# Patient Record
Sex: Female | Born: 1975 | Race: White | Hispanic: No | Marital: Married | Smoking: Never smoker
Health system: Southern US, Community
[De-identification: ages and names within clinical notes are randomized; demographics above are authoritative.]

---

## 2001-08-11 ENCOUNTER — Other Ambulatory Visit: Admission: RE | Admit: 2001-08-11 | Discharge: 2001-08-11 | Payer: Self-pay | Admitting: Gynecology

## 2002-09-15 ENCOUNTER — Other Ambulatory Visit: Admission: RE | Admit: 2002-09-15 | Discharge: 2002-09-15 | Payer: Self-pay | Admitting: Gynecology

## 2003-09-28 ENCOUNTER — Other Ambulatory Visit: Admission: RE | Admit: 2003-09-28 | Discharge: 2003-09-28 | Payer: Self-pay | Admitting: Gynecology

## 2005-01-11 ENCOUNTER — Other Ambulatory Visit: Admission: RE | Admit: 2005-01-11 | Discharge: 2005-01-11 | Payer: Self-pay | Admitting: Gynecology

## 2005-03-19 ENCOUNTER — Ambulatory Visit (HOSPITAL_COMMUNITY): Admission: RE | Admit: 2005-03-19 | Discharge: 2005-03-19 | Payer: Self-pay | Admitting: Family Medicine

## 2007-07-21 ENCOUNTER — Other Ambulatory Visit: Admission: RE | Admit: 2007-07-21 | Discharge: 2007-07-21 | Payer: Self-pay | Admitting: Obstetrics & Gynecology

## 2008-02-17 ENCOUNTER — Encounter: Payer: Self-pay | Admitting: Obstetrics & Gynecology

## 2008-02-17 ENCOUNTER — Inpatient Hospital Stay (HOSPITAL_COMMUNITY): Admission: RE | Admit: 2008-02-17 | Discharge: 2008-02-19 | Payer: Self-pay | Admitting: Obstetrics & Gynecology

## 2009-05-24 ENCOUNTER — Other Ambulatory Visit: Admission: RE | Admit: 2009-05-24 | Discharge: 2009-05-24 | Payer: Self-pay | Admitting: Obstetrics & Gynecology

## 2010-05-30 ENCOUNTER — Other Ambulatory Visit: Admission: RE | Admit: 2010-05-30 | Discharge: 2010-05-30 | Payer: Self-pay | Admitting: Obstetrics & Gynecology

## 2011-02-06 NOTE — Op Note (Signed)
Shelby Greene, Shelby Greene                ACCOUNT NO.:  [id number]   MEDICAL RECORD NO.:  [id number]          PATIENT TYPE:  INP   LOCATION:  9147                          FACILITY:  [facility]   PHYSICIAN:  Lazaro Arms, M.D.   DATE OF BIRTH:  1976/05/28   DATE OF PROCEDURE:  02/17/2008  DATE OF DISCHARGE:                               OPERATIVE REPORT   PREOPERATIVE DIAGNOSES:  1. Intrauterine pregnancy 39 plus weeks gestation.  2. Estimated fetal weight 4000 grams.  3. Inadequate pelvis with fetal vertex out of the pelvis and      unfavorable cervix.   POSTOPERATIVE DIAGNOSES:  1. Intrauterine pregnancy 39 plus weeks gestation.  2. Estimated fetal weight 4000 grams.  3. Inadequate pelvis with fetal vertex out of the pelvis and      unfavorable cervix.   PROCEDURE:  Primary low-transverse cesarean section.   SURGEON:  Lazaro Arms, MD   ANESTHESIA:  Spinal.   FINDINGS:  I talked with the patient and her husband in the office  extensively.  She had an ultrasound 2 weeks ago with an estimated fetal  weight of 3800 grams at the 97th percentile.  At this point, the fetal  vertex was out of the pelvis and was not induction candidate with cervix  being long taking close and presenting part not in the pelvis.  We  talked about her options of waiting for the fetal vertex to come down to  the pelvis versus proceeding with cesarean section with estimated fetal  weight around 4000 grams.  The patient really did not want to proceed  with a long induction if and when the fetal vertex drop due to the  pelvis.  She also does have somewhat narrow pubic arch.  So, she and her  husband decided to proceeded with primary cesarean section.  At the time  of surgery, we delivered a viable female.  Apgars of 9 at 10 with the  weight determined at nursery.  There was three-vessel cord.  She had a  nuchal cord and the cord was actually wrapped around both legs as well  individually, almost like a  figure-of-eight.  In the antrum, underwent  routine intrauterine resuscitation and to the pediatric service.  The  uterus, tubes, and ovaries were normal.  The placenta was normal and  sent pathology.  Cord gases had a pH 7.33.   DESCRIPTION OF OPERATION:  The patient was taken to the operating room  and placed in the sitting position where she underwent spinal  anesthetic.  She was then placed in the supine position with tilt to the  left.  She was prepped and draped in the usual sterile fashion.  A Foley  catheter was placed.  A Pfannenstiel skin incision was made and carried  down sharply to the rectus fascia, brought in midline, extended  laterally.  The fascia was taken out of the muscle superiorly and  inferiorly without difficulty and the muscles were divided.  No cavity  was entered.  Bladder blade was placed.  The vesicouterine aspiration  flap was created.  A  low-transverse hysterotomy incision was made.  The  patient was delivered a viable female infant with Apgars of 9 and 10 with  the weight determined at nursery.  There was three-vessel cord and cord  gas of 7.33.  The placenta was delivered spontaneously.  The uterus was  exteriorized and closed 2 layers, first being running interlocking  layer, the second being imbricating layer.  There was  good hemostasis.  The uterus placed in peritoneal cavity and the pelvis irrigated  vigorously with good hemostasis.  The muscles of peritoneum  reapproximated loosely.  The fascia was closed using 0 Vicryl running,  subcutaneous tissue was made hemostatic and irrigated and reapproximated  with interrupted 2-0 plain suture.  The  skin was closed using 4-0 Vicryl in subcuticular fashion and Dermabond.  The patient tolerated the procedure well.  She experienced 600 mL of  blood loss and taken to the recovery room in good and stable condition.  All counts were correct x3.  She received Ancef prophylactically.      Lazaro Arms, M.D.   Electronically Signed     LHE/MEDQ  D:  02/17/2008  T:  02/18/2008  Job:  [id number]

## 2011-02-06 NOTE — H&P (Signed)
NAMEMarland Kitchen  CHANON, LONEY NO.:  [id number]   MEDICAL RECORD NO.:  [id number]          PATIENT TYPE:  INP   LOCATION:  9147                          [facility]ACILITY:  [facility]   PHYSICIAN:  Lazaro Arms, M.D.   DATE OF BIRTH:  [birth year]   DATE OF ADMISSION:  02/17/2008  DATE OF DISCHARGE:                              HISTORY & PHYSICAL   HISTORY:  Shelby Greene is a 35 year old white female gravida 1, para 0,  estimated date of delivery Feb 19, 2008, currently 39 and 6/7th weeks'  gestation who is admitted for primary cesarean section.  The patient has  estimated fetal weight in the office 2 weeks ago of 3800 g.  We are here  at 39 weeks and 6 days' gestation, and the fetal vertex of the pelvis  and the cervix is long, thick, and closed.  We discussed all options are  at this point in toto with the estimated fetal weight being now  projected out 4000 g, and I was uncomfortable proceeding in a  nulliparous patient with the fetal vertex out of the pelvis for cervical  ripening and induction.  She understood this as well as her husband.  I  told them that we could certainly wait and see if the vertex came into  the pelvis within the next week or so and then proceed with induction.  They wanted to proceed at this point because of what I think is a fairly  significant risk of failure and/or shoulder dystocia based on the fetal  vertex out of the pelvis and estimated fetal weight of 4000 grams.  As a  result, we are proceeding with primary cesarean section.   PAST MEDICAL HISTORY:  Negative.   PAST SURGICAL HISTORY:  Negative except for eye surgery.   PAST OB HISTORY:  Nulliparous.   ALLERGIES:  MORPHINE makes her sick.   REVIEW OF SYSTEMS:  Otherwise, negative.   Her blood type is a O positive, antibody screen is negative, RPR is  negative, rubella is positive, hepatitis B is negative, HIV is negative,  urine drug screen is negative, her Pap is normal, GC and Chlamydia are  negative x2 and group B strep is negative.  varicella is immune.  AFP,  she declined.  Her glucola is 120.  All of her repeat HIV and RPRs are  negative.  Repeat GC and Chlamydia are negative.   PHYSICAL EXAMINATION:  HEENT:  Unremarkable.  Thyroid is normal.  LUNGS:  Clear.  HEART:  Regular rate and rhythm.  No murmur, rub, or gallop.  BREAST:  Deferred.  ABDOMEN:  Fundal height of 42 cm.  Cervix long, thick, and closed.  Fetal vertex out of the pelvis, narrow pubic arch.  EXTREMITIES:  Warm and 1+ edema.   IMPRESSION:  1. Intrauterine pregnancy at 57 and 6/7 weeks' gestation.  2. Estimated fetal weight 4000 g.  3. Fetal vertex out of the pelvis, unfavorable cervix.   PLAN:  The patient admitted for primary cesarean section.  She  understands risks, benefits, indications, alternatives, we will proceed.  Lazaro Arms, M.D.  Electronically Signed     LHE/MEDQ  D:  02/17/2008  T:  02/17/2008  Job:  [id number]

## 2011-02-06 NOTE — Discharge Summary (Signed)
NAMESARAFINA, PUTHOFF NO.:  [id number]   MEDICAL RECORD NO.:  [id number]          PATIENT TYPE:  INP   LOCATION:  9147                          FACILITY:  [facility]   PHYSICIAN:  Tilda Burrow, M.D. DATE OF BIRTH:  1976/04/24   DATE OF ADMISSION:  02/17/2008  DATE OF DISCHARGE:  02/19/2008                               DISCHARGE SUMMARY   ADMITTING DIAGNOSES:  Pregnancy 39+ weeks, large gestational age infant,  suspected inadequate pelvis.   POSTOPERATIVE DIAGNOSIS:  Pregnancy 39+ weeks, large gestational age  infant, suspected inadequate pelvis.   PROCEDURE:  Primary low transverse cervical cesarean section.   DISCHARGE MEDICATIONS:  1. Percocet 5/325 #40 one p.o. q.6 h. p.r.n. pain. 1-2 q.4 h. p.r.n.      pain.  2. Motrin 600 mg #30 one p.o. q.6 h. p.r.n. mild pain, refill x1.  3. Prenatal vitamins and iron to be continued daily.   FOLLOWUP:  Followup in 4 weeks.   HOSPITAL SUMMARY:  This 35 year old primiparous female followed at  [facility] for prenatal care.  She was admitted to [facility] for primary cesarean section as a _scheduled _primary c-  section__ by Dr. _Eure__  here for suspecting inadequate pelvis.  She  was admitted on Feb 17, 2008, for cesarean section.  Vertex is out of  the pelvis.  The patient underwent primary cesarean section with Apgars  of 9 and 10 at delivery.  The surgical procedure was uneventful with 600  mL of estimated blood loss.  Infant was a healthy female infant 7 pounds  and 15 ounces, 3605 grams.  Her postpartum course was uneventful with  the patient having blood type O positive, hemoglobin 10, and hematocrit  30.9 on Feb 18, 2008, compared to 13.7 and 39.2 on admission.  She was  stable for discharge to home on Feb 19, 2008, with incision looking  great.  No staples were used, and glue used to seal.  The patient will  be seeing at 4 weeks, routine postsurgical instructions were reviewed.      Tilda Burrow, M.D.  Electronically Signed     JVF/MEDQ  D:  02/19/2008  T:  02/19/2008  Job:  [id number]

## 2011-06-20 LAB — RPR: RPR Ser Ql: NONREACTIVE

## 2011-06-20 LAB — TYPE AND SCREEN
ABO/RH(D): O POS
Antibody Screen: NEGATIVE

## 2011-06-20 LAB — CBC
HCT: 39.2
Hemoglobin: 13.7
MCV: 85.7
MCV: 86.3
Platelets: 220
RBC: 3.58 — ABNORMAL LOW
RDW: 14.4
WBC: 10.4

## 2011-06-20 LAB — ABO/RH: ABO/RH(D): O POS

## 2011-06-20 LAB — URINALYSIS, ROUTINE W REFLEX MICROSCOPIC
Bilirubin Urine: NEGATIVE
Hgb urine dipstick: NEGATIVE
Nitrite: NEGATIVE
Protein, ur: NEGATIVE

## 2011-06-20 LAB — URINE MICROSCOPIC-ADD ON

## 2011-08-23 ENCOUNTER — Ambulatory Visit (HOSPITAL_COMMUNITY)
Admission: RE | Admit: 2011-08-23 | Discharge: 2011-08-23 | Disposition: A | Discharge status: Home / Self Care | Payer: BC Managed Care – PPO | Source: Ambulatory Visit | Attending: Internal Medicine | Admitting: Internal Medicine

## 2011-08-23 ENCOUNTER — Other Ambulatory Visit (HOSPITAL_COMMUNITY): Payer: Self-pay | Admitting: Internal Medicine

## 2011-08-23 DIAGNOSIS — J069 Acute upper respiratory infection, unspecified: Secondary | ICD-10-CM

## 2011-08-23 DIAGNOSIS — R05 Cough: Secondary | ICD-10-CM

## 2011-08-23 DIAGNOSIS — R059 Cough, unspecified: Secondary | ICD-10-CM

## 2011-11-01 ENCOUNTER — Other Ambulatory Visit: Payer: Self-pay | Admitting: Obstetrics & Gynecology

## 2011-11-01 ENCOUNTER — Other Ambulatory Visit (HOSPITAL_COMMUNITY)
Admission: RE | Admit: 2011-11-01 | Discharge: 2011-11-01 | Disposition: A | Discharge status: Home / Self Care | Payer: BC Managed Care – PPO | Source: Ambulatory Visit | Attending: Obstetrics & Gynecology | Admitting: Obstetrics & Gynecology

## 2011-11-01 DIAGNOSIS — Z01419 Encounter for gynecological examination (general) (routine) without abnormal findings: Secondary | ICD-10-CM | POA: Insufficient documentation

## 2012-04-13 ENCOUNTER — Encounter (HOSPITAL_COMMUNITY): Payer: Self-pay | Admitting: *Deleted

## 2012-04-13 ENCOUNTER — Emergency Department (HOSPITAL_COMMUNITY): Payer: BC Managed Care – PPO

## 2012-04-13 ENCOUNTER — Emergency Department (HOSPITAL_COMMUNITY)
Admission: EM | Admit: 2012-04-13 | Discharge: 2012-04-13 | Disposition: A | Discharge status: Home / Self Care | Payer: BC Managed Care – PPO | Attending: Emergency Medicine | Admitting: Emergency Medicine

## 2012-04-13 DIAGNOSIS — N200 Calculus of kidney: Secondary | ICD-10-CM | POA: Insufficient documentation

## 2012-04-13 LAB — URINALYSIS, ROUTINE W REFLEX MICROSCOPIC
Bilirubin Urine: NEGATIVE
Ketones, ur: NEGATIVE mg/dL
Nitrite: NEGATIVE
Protein, ur: NEGATIVE mg/dL
Specific Gravity, Urine: 1.022 (ref 1.005–1.030)
pH: 5.5 (ref 5.0–8.0)

## 2012-04-13 LAB — CBC WITH DIFFERENTIAL/PLATELET
Basophils Relative: 0 % (ref 0–1)
Eosinophils Absolute: 0.2 10*3/uL (ref 0.0–0.7)
HCT: 45.2 % (ref 36.0–46.0)
Lymphocytes Relative: 33 % (ref 12–46)
MCHC: 33.8 g/dL (ref 30.0–36.0)
Monocytes Absolute: 0.9 10*3/uL (ref 0.1–1.0)
Monocytes Relative: 7 % (ref 3–12)
Neutro Abs: 7.5 10*3/uL (ref 1.7–7.7)
Neutrophils Relative %: 59 % (ref 43–77)

## 2012-04-13 LAB — COMPREHENSIVE METABOLIC PANEL
AST: 15 U/L (ref 0–37)
BUN: 9 mg/dL (ref 6–23)
Calcium: 9.6 mg/dL (ref 8.4–10.5)
Chloride: 104 mEq/L (ref 96–112)
Creatinine, Ser: 0.68 mg/dL (ref 0.50–1.10)
GFR calc Af Amer: >90 mL/min (ref >90)
GFR calc non Af Amer: >90 mL/min (ref >90)
Potassium: 3.4 mEq/L — ABNORMAL LOW (ref 3.5–5.1)
Total Bilirubin: 0.3 mg/dL (ref 0.3–1.2)

## 2012-04-13 LAB — URINE MICROSCOPIC-ADD ON

## 2012-04-13 LAB — PREGNANCY, URINE: Preg Test, Ur: NEGATIVE

## 2012-04-13 MED ORDER — HYDROMORPHONE HCL PF 1 MG/ML IJ SOLN
1.0000 mg | Freq: Once | INTRAMUSCULAR | Status: AC
  Administered 2012-04-13: 1 mg via INTRAVENOUS
  Filled 2012-04-13: qty 1

## 2012-04-13 MED ORDER — OXYCODONE-ACETAMINOPHEN 10-325 MG PO TABS: 1.0000 | ORAL_TABLET | ORAL | Status: AC | PRN

## 2012-04-13 MED ORDER — ONDANSETRON HCL 4 MG/2ML IJ SOLN
4.0000 mg | Freq: Once | INTRAMUSCULAR | Status: AC
  Administered 2012-04-13: 4 mg via INTRAVENOUS
  Filled 2012-04-13: qty 2

## 2012-04-13 MED ORDER — CIPROFLOXACIN HCL 500 MG PO TABS: 500.0000 mg | ORAL_TABLET | Freq: Two times a day (BID) | ORAL | Status: AC

## 2012-04-13 MED ORDER — SODIUM CHLORIDE 0.9 % IV BOLUS (SEPSIS)
1000.0000 mL | Freq: Once | INTRAVENOUS | Status: AC
  Administered 2012-04-13: 1000 mL via INTRAVENOUS

## 2012-04-13 MED ORDER — FLUCONAZOLE 200 MG PO TABS: 200.0000 mg | ORAL_TABLET | Freq: Every day | ORAL | Status: AC

## 2012-04-13 MED ORDER — TAMSULOSIN HCL 0.4 MG PO CAPS: 0.4000 mg | ORAL_CAPSULE | Freq: Every day | ORAL | Status: DC

## 2012-04-13 MED ORDER — OXYCODONE-ACETAMINOPHEN 5-325 MG PO TABS
2.0000 | ORAL_TABLET | Freq: Once | ORAL | Status: AC
  Administered 2012-04-13: 2 via ORAL
  Filled 2012-04-13: qty 2

## 2012-04-13 MED ORDER — CIPROFLOXACIN IN D5W 400 MG/200ML IV SOLN
400.0000 mg | Freq: Once | INTRAVENOUS | Status: AC
  Administered 2012-04-13: 400 mg via INTRAVENOUS
  Filled 2012-04-13: qty 200

## 2012-04-13 MED ORDER — KETOROLAC TROMETHAMINE 30 MG/ML IJ SOLN
30.0000 mg | Freq: Once | INTRAMUSCULAR | Status: AC
  Administered 2012-04-13: 30 mg via INTRAMUSCULAR
  Filled 2012-04-13: qty 1

## 2012-04-13 NOTE — ED Notes (Signed)
Pt states unable to void.

## 2012-04-13 NOTE — ED Notes (Signed)
The pt says she cannot void 

## 2012-04-13 NOTE — ED Provider Notes (Signed)
Medical screening examination/treatment/procedure(s) were conducted as a shared visit with non-physician practitioner(s) and myself.  I personally evaluated the patient during the encounter 36 year old, female, complains of right flank pain, with nausea, which woke her up from sleep.  No history of the same.  No fever, vomiting, dysuria, or hematuria.  The patient was unable to get comfortable.  CAT scan shows obstructing stone.  Urinalysis shows bacteria.  Pain controlled.  Patient is afebrile.  No peritoneal signs.  We will give her and miotics and console.  Urology.  Cheri Guppy, MD 04/13/12 [phone]

## 2012-04-13 NOTE — ED Provider Notes (Signed)
History     CSN: [id number]  Arrival date & time 04/13/12  0102   First MD Initiated Contact with Patient 04/13/12 0255      Chief Complaint  Patient presents with  . Flank Pain   HPI  History provided by the patient. Patient is a 36 year old female with no significant past medical history who presents with complaints of acute onset right flank pain around 11 PM. Pain will patient while trying to sleep. Pain is described as very severe and sharp. There is slight radiation to the groin. Patient also reports having some dysuria. She denies noticing any hematuria. Patient denies any prior symptoms for onset of pains. Symptoms were associated with episode of nausea and vomiting. Patient states pain is slightly improving but still persistent. She denies any fever, chills, sweats. Patient denies any prior history of similar symptoms. No history of kidney stones. Patient does have father with history of multiple kidney stones.     History reviewed. No pertinent past medical history.  History reviewed. No pertinent past surgical history.  No family history on file.  History  Substance Use Topics  . Smoking status: Never Smoker   . Smokeless tobacco: Not on file  . Alcohol Use: No    OB History    Grav Para Term Preterm Abortions TAB SAB Ect Mult Living                  Review of Systems  Constitutional: Negative for fever and appetite change.  Respiratory: Negative for shortness of breath.   Cardiovascular: Negative for chest pain.  Gastrointestinal: Positive for nausea and vomiting. Negative for abdominal pain.  Genitourinary: Positive for dysuria and flank pain. Negative for frequency, hematuria, vaginal bleeding and vaginal discharge.    Allergies  Morphine and related  Home Medications   Current Outpatient Rx  Name Route Sig Dispense Refill  . NORETHINDRONE-ETH ESTRADIOL 1-50 MG-MCG PO TABS Oral Take 1 tablet by mouth daily.      BP 110/62  Pulse 72  Temp 98 F  (36.7 C) (Oral)  Resp 20  SpO2 95%  Physical Exam  Nursing note and vitals reviewed. Constitutional: She is oriented to person, place, and time. She appears well-developed and well-nourished. No distress.  HENT:  Head: Normocephalic.  Cardiovascular: Normal rate and regular rhythm.   Pulmonary/Chest: Effort normal and breath sounds normal.  Abdominal: Soft. There is CVA tenderness. There is no rigidity, no rebound, no guarding and no tenderness at McBurney's point.       Right CVA tenderness  Neurological: She is alert and oriented to person, place, and time.  Skin: Skin is warm and dry.  Psychiatric: She has a normal mood and affect. Her behavior is normal.    ED Course  Procedures  Results for orders placed during the hospital encounter of 04/13/12  URINALYSIS, ROUTINE W REFLEX MICROSCOPIC      Component Value Range   Color, Urine YELLOW  YELLOW   APPearance CLOUDY (*) CLEAR   Specific Gravity, Urine 1.022  1.005 - 1.030   pH 5.5  5.0 - 8.0   Glucose, UA NEGATIVE  NEGATIVE mg/dL   Hgb urine dipstick LARGE (*) NEGATIVE   Bilirubin Urine NEGATIVE  NEGATIVE   Ketones, ur NEGATIVE  NEGATIVE mg/dL   Protein, ur NEGATIVE  NEGATIVE mg/dL   Urobilinogen, UA 1.0  0.0 - 1.0 mg/dL   Nitrite NEGATIVE  NEGATIVE   Leukocytes, UA SMALL (*) NEGATIVE  PREGNANCY, URINE  Component Value Range   Preg Test, Ur NEGATIVE  NEGATIVE  CBC WITH DIFFERENTIAL      Component Value Range   WBC 12.8 (*) 4.0 - 10.5 K/uL   RBC 5.29 (*) 3.87 - 5.11 MIL/uL   Hemoglobin 15.3 (*) 12.0 - 15.0 g/dL   HCT 16.1  09.6 - 04.5 %   MCV 85.4  78.0 - 100.0 fL   MCH 28.9  26.0 - 34.0 pg   MCHC 33.8  30.0 - 36.0 g/dL   RDW 40.9  81.1 - 91.4 %   Platelets 248  150 - 400 K/uL   Neutrophils Relative 59  43 - 77 %   Neutro Abs 7.5  1.7 - 7.7 K/uL   Lymphocytes Relative 33  12 - 46 %   Lymphs Abs 4.3 (*) 0.7 - 4.0 K/uL   Monocytes Relative 7  3 - 12 %   Monocytes Absolute 0.9  0.1 - 1.0 K/uL   Eosinophils  Relative 1  0 - 5 %   Eosinophils Absolute 0.2  0.0 - 0.7 K/uL   Basophils Relative 0  0 - 1 %   Basophils Absolute 0.0  0.0 - 0.1 K/uL  COMPREHENSIVE METABOLIC PANEL      Component Value Range   Sodium 140  135 - 145 mEq/L   Potassium 3.4 (*) 3.5 - 5.1 mEq/L   Chloride 104  96 - 112 mEq/L   CO2 22  19 - 32 mEq/L   Glucose, Bld 111 (*) 70 - 99 mg/dL   BUN 9  6 - 23 mg/dL   Creatinine, Ser 7.82  0.50 - 1.10 mg/dL   Calcium 9.6  8.4 - 95.6 mg/dL   Total Protein 7.6  6.0 - 8.3 g/dL   Albumin 3.7  3.5 - 5.2 g/dL   AST 15  0 - 37 U/L   ALT 12  0 - 35 U/L   Alkaline Phosphatase 72  39 - 117 U/L   Total Bilirubin 0.3  0.3 - 1.2 mg/dL   GFR calc non Af Amer >90  >90 mL/min   GFR calc Af Amer >90  >90 mL/min  URINE MICROSCOPIC-ADD ON      Component Value Range   Squamous Epithelial / LPF FEW (*) RARE   WBC, UA 3-6  <3 WBC/hpf   RBC / HPF 21-50  <3 RBC/hpf   Bacteria, UA MANY (*) RARE   Urine-Other MUCOUS PRESENT         Ct Abdomen Pelvis Wo Contrast  04/13/2012  *RADIOLOGY REPORT*  Clinical Data: Severe right flank pain.  CT ABDOMEN AND PELVIS WITHOUT CONTRAST  Technique:  Multidetector CT imaging of the abdomen and pelvis was performed following the standard protocol without intravenous contrast.  Comparison: None.  Findings: Mild dependent changes in the lung bases.  Small esophageal hiatal hernia.  There is a right-sided pyelocaliectasis and ureterectasis down to the ureterovesicle junction where there is a 3 mm stone.  Mild periureteral stranding.  Changes are consistent with mild to moderate obstruction.  No renal or ureteral stone or obstruction on the left.  Bladder is decompressed.  The unenhanced appearance of the liver, spleen, gallbladder, pancreas, adrenal glands, abdominal aorta, and retroperitoneal lymph nodes is unremarkable.  The stomach, small bowel, and colon are decompressed.  No free air or free fluid in the abdomen.  A small accessory spleen.  Pelvis:  The uterus and  adnexal structures are not enlarged.  No free or loculated pelvic fluid collections.  No inflammatory changes in the sigmoid colon.  The appendix is normal.  Normal alignment of the lumbar vertebrae with mild degenerative changes in the lower thoracic spine.  IMPRESSION: 3 mm stone in the distal right ureter with mild to moderate proximal obstruction.  Original Report Authenticated By: Marlon Pel, M.D.     1. Kidney stone       MDM  3:10AM patient seen and evaluated. Patient is uncomfortable appearing. Patient has right CVA tenderness. Also with a lot of blood in urine. Wasn't CT scan to rule out possible kidney stone. We'll also treat with pain medications. We'll give portal, a lot of and Zofran.   Patient discussed with attending physician. Patient with signs of a 3 mm stone distal right ureter. Urine does show 3-6 white blood cells and many bacteria. Patient is currently afebrile but we'll plan to consult neurology for concerns of possible septic stone or to have close followup.  Spoke with on-call urologist. Recommend starting patient on either Cipro or Bactrim. They will follow patient in office. Patient should be instructed to return sooner either to emergency room or followup in the office if develops any fever, chills, sweats or persistent nausea and vomiting. Will also send urine for culture.      Angus Seller, [place] 04/14/12 2211

## 2012-04-13 NOTE — ED Notes (Signed)
Pt states understanding of discharge instructions 

## 2012-04-13 NOTE — ED Notes (Signed)
The pt is c/o severe rt flank pain since 1630.  No previous history.  lmp none

## 2012-04-14 LAB — URINE CULTURE
Colony Count: NO GROWTH
Culture: NO GROWTH

## 2012-04-15 NOTE — ED Provider Notes (Signed)
Medical screening examination/treatment/procedure(s) were performed by non-physician practitioner and as supervising physician I was immediately available for consultation/collaboration.  Kassadie Pancake, MD 04/15/12 0702 

## 2012-10-10 ENCOUNTER — Other Ambulatory Visit: Payer: Self-pay | Admitting: Adult Health

## 2012-10-10 DIAGNOSIS — N644 Mastodynia: Secondary | ICD-10-CM

## 2012-10-10 DIAGNOSIS — N6320 Unspecified lump in the left breast, unspecified quadrant: Secondary | ICD-10-CM

## 2012-10-15 ENCOUNTER — Ambulatory Visit
Admission: RE | Admit: 2012-10-15 | Discharge: 2012-10-15 | Disposition: A | Discharge status: Home / Self Care | Payer: BC Managed Care – PPO | Source: Ambulatory Visit | Attending: Adult Health | Admitting: Adult Health

## 2012-10-15 DIAGNOSIS — N644 Mastodynia: Secondary | ICD-10-CM

## 2012-10-15 DIAGNOSIS — N6320 Unspecified lump in the left breast, unspecified quadrant: Secondary | ICD-10-CM

## 2012-10-20 ENCOUNTER — Other Ambulatory Visit: Payer: BC Managed Care – PPO

## 2012-12-16 ENCOUNTER — Other Ambulatory Visit: Payer: Self-pay | Admitting: Obstetrics & Gynecology

## 2013-03-02 ENCOUNTER — Other Ambulatory Visit: Payer: Self-pay | Admitting: *Deleted

## 2013-03-02 MED ORDER — ETONOGESTREL-ETHINYL ESTRADIOL 0.12-0.015 MG/24HR VA RING: VAGINAL_RING | VAGINAL | Status: DC

## 2013-07-13 ENCOUNTER — Telehealth: Payer: Self-pay | Admitting: Obstetrics & Gynecology

## 2013-07-13 NOTE — Telephone Encounter (Signed)
Pt c/o vaginal burning and itching, given abx for sinus infection last week. Pt requesting Diflucan e-scribed.

## 2013-07-14 MED ORDER — FLUCONAZOLE 150 MG PO TABS: 150.0000 mg | ORAL_TABLET | Freq: Once | ORAL | Status: DC

## 2013-07-14 NOTE — Telephone Encounter (Signed)
Pt informed Diflucan e-scribed 

## 2014-01-21 ENCOUNTER — Other Ambulatory Visit (HOSPITAL_COMMUNITY)
Admission: RE | Admit: 2014-01-21 | Discharge: 2014-01-21 | Disposition: A | Discharge status: Home / Self Care | Payer: BC Managed Care – PPO | Source: Ambulatory Visit | Attending: Obstetrics & Gynecology | Admitting: Obstetrics & Gynecology

## 2014-01-21 ENCOUNTER — Ambulatory Visit (INDEPENDENT_AMBULATORY_CARE_PROVIDER_SITE_OTHER): Payer: BC Managed Care – PPO | Admitting: Obstetrics & Gynecology

## 2014-01-21 ENCOUNTER — Encounter: Payer: Self-pay | Admitting: Obstetrics & Gynecology

## 2014-01-21 VITALS — BP 110/80 | Ht 63.0 in | Wt 190.0 lb

## 2014-01-21 DIAGNOSIS — Z01419 Encounter for gynecological examination (general) (routine) without abnormal findings: Secondary | ICD-10-CM

## 2014-01-21 DIAGNOSIS — E039 Hypothyroidism, unspecified: Secondary | ICD-10-CM

## 2014-01-21 DIAGNOSIS — Z1151 Encounter for screening for human papillomavirus (HPV): Secondary | ICD-10-CM | POA: Insufficient documentation

## 2014-01-21 MED ORDER — HYDROCORTISONE 2.5 % EX CREA: TOPICAL_CREAM | Freq: Two times a day (BID) | CUTANEOUS | Status: DC

## 2014-01-21 MED ORDER — ETONOGESTREL-ETHINYL ESTRADIOL 0.12-0.015 MG/24HR VA RING: VAGINAL_RING | VAGINAL | Status: DC

## 2014-01-21 NOTE — Progress Notes (Signed)
Patient ID: Shelby Greene, female   DOB: [birth year], 38 y.o.   MRN: [id number] Subjective:     Shelby Greene is a 38 y.o. female here for a routine exam.  No LMP recorded. Patient is not currently having periods (Reason: Oral contraceptives). No obstetric history on file. Birth Control Method:  nuva ring Menstrual Calendar(currently): regular  Current complaints: none.   Current acute medical issues:  none   Recent Gynecologic History No LMP recorded. Patient is not currently having periods (Reason: Oral contraceptives). Last Pap: 2013,  normal Last mammogram: ,    History reviewed. No pertinent past medical history.  History reviewed. No pertinent past surgical history.  OB History   Grav Para Term Preterm Abortions TAB SAB Ect Mult Living                  History   Social History  . Marital Status: Married    Spouse Name: N/A    Number of Children: N/A  . Years of Education: N/A   Social History Main Topics  . Smoking status: Never Smoker   . Smokeless tobacco: None  . Alcohol Use: No  . Drug Use: None  . Sexual Activity: Yes   Other Topics Concern  . None   Social History Narrative  . None    History reviewed. No pertinent family history.   Review of Systems  Review of Systems  Constitutional: Negative for fever, chills, weight loss, malaise/fatigue and diaphoresis.  HENT: Negative for hearing loss, ear pain, nosebleeds, congestion, sore throat, neck pain, tinnitus and ear discharge.   Eyes: Negative for blurred vision, double vision, photophobia, pain, discharge and redness.  Respiratory: Negative for cough, hemoptysis, sputum production, shortness of breath, wheezing and stridor.   Cardiovascular: Negative for chest pain, palpitations, orthopnea, claudication, leg swelling and PND.  Gastrointestinal: negative for abdominal pain. Negative for heartburn, nausea, vomiting, diarrhea, constipation, blood in stool and melena.  Genitourinary: Negative for dysuria,  urgency, frequency, hematuria and flank pain.  Musculoskeletal: Negative for myalgias, back pain, joint pain and falls.  Skin: Negative for itching and rash.  Neurological: Negative for dizziness, tingling, tremors, sensory change, speech change, focal weakness, seizures, loss of consciousness, weakness and headaches.  Endo/Heme/Allergies: Negative for environmental allergies and polydipsia. Does not bruise/bleed easily.  Psychiatric/Behavioral: Negative for depression, suicidal ideas, hallucinations, memory loss and substance abuse. The patient is not nervous/anxious and does not have insomnia.        Objective:    Physical Exam  Vitals reviewed. Constitutional: She is oriented to person, place, and time. She appears well-developed and well-nourished.  HENT:  Head: Normocephalic and atraumatic.        Right Ear: External ear normal.  Left Ear: External ear normal.  Nose: Nose normal.  Mouth/Throat: Oropharynx is clear and moist.  Eyes: Conjunctivae and EOM are normal. Pupils are equal, round, and reactive to light. Right eye exhibits no discharge. Left eye exhibits no discharge. No scleral icterus.  Neck: Normal range of motion. Neck supple. No tracheal deviation present. No thyromegaly present.  Cardiovascular: Normal rate, regular rhythm, normal heart sounds and intact distal pulses.  Exam reveals no gallop and no friction rub.   No murmur heard. Respiratory: Effort normal and breath sounds normal. No respiratory distress. She has no wheezes. She has no rales. She exhibits no tenderness.  GI: Soft. Bowel sounds are normal. She exhibits no distension and no mass. There is no tenderness. There is no rebound and no guarding.  Genitourinary:  Breasts no masses skin changes or nipple changes bilaterally      Vulva is normal without lesions Vagina is pink moist without discharge Cervix normal in appearance and pap is done Uterus is normal size shape and contour Adnexa is negative with  normal sized ovaries    Musculoskeletal: Normal range of motion. She exhibits no edema and no tenderness.  Neurological: She is alert and oriented to person, place, and time. She has normal reflexes. She displays normal reflexes. No cranial nerve deficit. She exhibits normal muscle tone. Coordination normal.  Skin: Skin is warm and dry. No rash noted. No erythema. No pallor.  Psychiatric: She has a normal mood and affect. Her behavior is normal. Judgment and thought content normal.       Assessment:    Healthy female exam.    Plan:    Contraception: NuvaRing vaginal inserts. Follow up in: 1 year.

## 2014-01-21 NOTE — Addendum Note (Signed)
Addended by: Richardson ChiquitoRAVIS, ASHLEY M on: 01/21/2014 04:11 PM   Modules accepted: Orders

## 2014-01-21 NOTE — Addendum Note (Signed)
Addended by: Criss AlvinePULLIAM, Jolina Symonds G on: 01/21/2014 03:26 PM   Modules accepted: Orders

## 2014-01-22 LAB — TSH: TSH: 2.141 u[IU]/mL (ref 0.350–4.500)

## 2014-02-04 ENCOUNTER — Telehealth: Payer: Self-pay | Admitting: Obstetrics & Gynecology

## 2014-02-04 NOTE — Telephone Encounter (Signed)
Pt informed of within normal pap results and TSH from 01/21/2014.

## 2014-04-12 ENCOUNTER — Telehealth: Payer: Self-pay | Admitting: *Deleted

## 2014-04-12 NOTE — Telephone Encounter (Signed)
Pt states treated with antibiotics for sinus infection, now has a yeast infection. Pt aware Dr. Despina HiddenEure has left office for today will be back in office tomorrow.   Requesting RX for yeast infection.

## 2014-04-13 ENCOUNTER — Other Ambulatory Visit: Payer: Self-pay | Admitting: Obstetrics & Gynecology

## 2014-04-13 ENCOUNTER — Telehealth: Payer: Self-pay | Admitting: Obstetrics & Gynecology

## 2014-04-13 NOTE — Telephone Encounter (Signed)
Pt informed request for Diflucan is in Dr. Alyssa GroveEures in basket and is still pending, we did not call in Nuvaring instead of the Diflucan.

## 2014-07-28 ENCOUNTER — Telehealth: Payer: Self-pay | Admitting: *Deleted

## 2014-07-28 ENCOUNTER — Telehealth: Payer: Self-pay | Admitting: Obstetrics & Gynecology

## 2014-07-28 NOTE — Telephone Encounter (Signed)
Pt requesting Diflucan recently prescribed antibiotic for sinus infection.

## 2014-07-28 NOTE — Telephone Encounter (Signed)
Duplicate message. 

## 2014-07-29 MED ORDER — FLUCONAZOLE 150 MG PO TABS: ORAL_TABLET | ORAL | Status: DC

## 2014-12-14 ENCOUNTER — Telehealth: Payer: Self-pay | Admitting: Obstetrics & Gynecology

## 2014-12-14 MED ORDER — SULFAMETHOXAZOLE-TRIMETHOPRIM 800-160 MG PO TABS: 1.0000 | ORAL_TABLET | Freq: Two times a day (BID) | ORAL | Status: DC

## 2014-12-14 NOTE — Telephone Encounter (Signed)
Pt c/o burning with urination, urgency with little output x 1 day. Pt requesting RX for antibiotic.

## 2014-12-15 ENCOUNTER — Telehealth: Payer: Self-pay | Admitting: Obstetrics & Gynecology

## 2014-12-15 MED ORDER — FLUCONAZOLE 150 MG PO TABS: ORAL_TABLET | ORAL | Status: DC

## 2014-12-15 NOTE — Telephone Encounter (Signed)
Pt requesting RX for Diflucan with refill.

## 2015-02-13 ENCOUNTER — Other Ambulatory Visit: Payer: Self-pay | Admitting: Obstetrics & Gynecology

## 2015-02-14 ENCOUNTER — Telehealth: Payer: Self-pay | Admitting: *Deleted

## 2015-02-14 NOTE — Telephone Encounter (Signed)
Requesting RX for Nuvaring

## 2015-06-14 ENCOUNTER — Telehealth: Payer: Self-pay | Admitting: Obstetrics & Gynecology

## 2015-06-14 MED ORDER — FLUCONAZOLE 150 MG PO TABS: ORAL_TABLET | ORAL | Status: DC

## 2015-06-14 NOTE — Telephone Encounter (Signed)
Pt requesting refill on Diflucan.  

## 2016-04-09 ENCOUNTER — Telehealth: Payer: Self-pay | Admitting: *Deleted

## 2016-04-09 NOTE — Telephone Encounter (Signed)
Pharmacy called requesting refill on Nuvaring

## 2016-04-10 ENCOUNTER — Telehealth: Payer: Self-pay | Admitting: Obstetrics & Gynecology

## 2016-04-10 MED ORDER — ETONOGESTREL-ETHINYL ESTRADIOL 0.12-0.015 MG/24HR VA RING: VAGINAL_RING | VAGINAL | Status: DC

## 2016-06-07 ENCOUNTER — Other Ambulatory Visit: Payer: Self-pay | Admitting: Adult Health

## 2016-06-07 DIAGNOSIS — Z1231 Encounter for screening mammogram for malignant neoplasm of breast: Secondary | ICD-10-CM

## 2016-06-25 ENCOUNTER — Other Ambulatory Visit: Payer: BC Managed Care – PPO | Admitting: Obstetrics & Gynecology

## 2016-07-03 ENCOUNTER — Ambulatory Visit
Admission: RE | Admit: 2016-07-03 | Discharge: 2016-07-03 | Disposition: A | Discharge status: Home / Self Care | Payer: BC Managed Care – PPO | Source: Ambulatory Visit | Attending: Adult Health | Admitting: Adult Health

## 2016-07-03 ENCOUNTER — Encounter: Payer: Self-pay | Admitting: Obstetrics & Gynecology

## 2016-07-03 ENCOUNTER — Ambulatory Visit (INDEPENDENT_AMBULATORY_CARE_PROVIDER_SITE_OTHER): Payer: BC Managed Care – PPO | Admitting: Obstetrics & Gynecology

## 2016-07-03 ENCOUNTER — Other Ambulatory Visit (HOSPITAL_COMMUNITY)
Admission: RE | Admit: 2016-07-03 | Discharge: 2016-07-03 | Disposition: A | Discharge status: Home / Self Care | Payer: BC Managed Care – PPO | Source: Ambulatory Visit | Attending: Obstetrics & Gynecology | Admitting: Obstetrics & Gynecology

## 2016-07-03 VITALS — BP 128/90 | HR 90 | Ht 64.0 in | Wt 216.0 lb

## 2016-07-03 DIAGNOSIS — E7800 Pure hypercholesterolemia, unspecified: Secondary | ICD-10-CM

## 2016-07-03 DIAGNOSIS — R7989 Other specified abnormal findings of blood chemistry: Secondary | ICD-10-CM

## 2016-07-03 DIAGNOSIS — E038 Other specified hypothyroidism: Secondary | ICD-10-CM

## 2016-07-03 DIAGNOSIS — Z01419 Encounter for gynecological examination (general) (routine) without abnormal findings: Secondary | ICD-10-CM | POA: Insufficient documentation

## 2016-07-03 DIAGNOSIS — Z1231 Encounter for screening mammogram for malignant neoplasm of breast: Secondary | ICD-10-CM

## 2016-07-03 MED ORDER — ETONOGESTREL-ETHINYL ESTRADIOL 0.12-0.015 MG/24HR VA RING: VAGINAL_RING | VAGINAL | 12 refills | Status: DC

## 2016-07-03 NOTE — Progress Notes (Signed)
Subjective:     Shelby MillerDeidra Greene is a 40 y.o. female here for a routine exam.  No LMP recorded. Patient is not currently having periods (Reason: Oral contraceptives). No obstetric history on file. Birth Control Method:  nuva ring Menstrual Calendar(currently): regular  Current complaints: none.   Current acute medical issues:  none   Recent Gynecologic History No LMP recorded. Patient is not currently having periods (Reason: Oral contraceptives). Last Pap: 2014,  normal Last mammogram: ,    History reviewed. No pertinent past medical history.  History reviewed. No pertinent surgical history.  OB History    No data available      Social History   Social History  . Marital status: Married    Spouse name: N/A  . Number of children: N/A  . Years of education: N/A   Social History Main Topics  . Smoking status: Never Smoker  . Smokeless tobacco: Never Used  . Alcohol use No  . Drug use: Unknown  . Sexual activity: Yes   Other Topics Concern  . None   Social History Narrative  . None    History reviewed. No pertinent family history.   Current Outpatient Prescriptions:  .  etonogestrel-ethinyl estradiol (NUVARING) 0.12-0.015 MG/24HR vaginal ring, INSERT VAGINALLY AND LEAVE IN PLACE FOR 3 CONSECUTIVE WEEKS THEN REMOVE FOR 1 WEEK, Disp: 1 each, Rfl: 11 .  etonogestrel-ethinyl estradiol (NUVARING) 0.12-0.015 MG/24HR vaginal ring, Insert vaginally and leave in place for 3 consecutive weeks, then remove for 1 week., Disp: 1 each, Rfl: 12 .  norethindrone-ethinyl estradiol (OVCON-50) 1-50 MG-MCG tablet, Take 1 tablet by mouth daily., Disp: , Rfl:   Review of Systems  Review of Systems  Constitutional: Negative for fever, chills, weight loss, malaise/fatigue and diaphoresis.  HENT: Negative for hearing loss, ear pain, nosebleeds, congestion, sore throat, neck pain, tinnitus and ear discharge.   Eyes: Negative for blurred vision, double vision, photophobia, pain, discharge and  redness.  Respiratory: Negative for cough, hemoptysis, sputum production, shortness of breath, wheezing and stridor.   Cardiovascular: Negative for chest pain, palpitations, orthopnea, claudication, leg swelling and PND.  Gastrointestinal: negative for abdominal pain. Negative for heartburn, nausea, vomiting, diarrhea, constipation, blood in stool and melena.  Genitourinary: Negative for dysuria, urgency, frequency, hematuria and flank pain.  Musculoskeletal: Negative for myalgias, back pain, joint pain and falls.  Skin: Negative for itching and rash.  Neurological: Negative for dizziness, tingling, tremors, sensory change, speech change, focal weakness, seizures, loss of consciousness, weakness and headaches.  Endo/Heme/Allergies: Negative for environmental allergies and polydipsia. Does not bruise/bleed easily.  Psychiatric/Behavioral: Negative for depression, suicidal ideas, hallucinations, memory loss and substance abuse. The patient is not nervous/anxious and does not have insomnia.        Objective:  Blood pressure 128/90, pulse 90, height 5\' 4"  (1.626 m), weight 216 lb (98 kg).   Physical Exam  Vitals reviewed. Constitutional: She is oriented to person, place, and time. She appears well-developed and well-nourished.  HENT:  Head: Normocephalic and atraumatic.        Right Ear: External ear normal.  Left Ear: External ear normal.  Nose: Nose normal.  Mouth/Throat: Oropharynx is clear and moist.  Eyes: Conjunctivae and EOM are normal. Pupils are equal, round, and reactive to light. Right eye exhibits no discharge. Left eye exhibits no discharge. No scleral icterus.  Neck: Normal range of motion. Neck supple. No tracheal deviation present. No thyromegaly present.  Cardiovascular: Normal rate, regular rhythm, normal heart sounds and intact distal pulses.  Exam reveals no gallop and no friction rub.   No murmur heard. Respiratory: Effort normal and breath sounds normal. No respiratory  distress. She has no wheezes. She has no rales. She exhibits no tenderness.  GI: Soft. Bowel sounds are normal. She exhibits no distension and no mass. There is no tenderness. There is no rebound and no guarding.  Genitourinary:  Breasts no masses skin changes or nipple changes bilaterally      Vulva is normal without lesions Vagina is pink moist without discharge Cervix normal in appearance and pap is done Uterus is normal size shape and contour Adnexa is negative with normal sized ovaries   Musculoskeletal: Normal range of motion. She exhibits no edema and no tenderness.  Neurological: She is alert and oriented to person, place, and time. She has normal reflexes. She displays normal reflexes. No cranial nerve deficit. She exhibits normal muscle tone. Coordination normal.  Skin: Skin is warm and dry. No rash noted. No erythema. No pallor.  Psychiatric: She has a normal mood and affect. Her behavior is normal. Judgment and thought content normal.       Medications Ordered at today's visit: Meds ordered this encounter  Medications  . etonogestrel-ethinyl estradiol (NUVARING) 0.12-0.015 MG/24HR vaginal ring    Sig: Insert vaginally and leave in place for 3 consecutive weeks, then remove for 1 week.    Dispense:  1 each    Refill:  12    Other orders placed at today's visit: Orders Placed This Encounter  Procedures  . Lipid Profile  . TSH  . Vitamin D (25 hydroxy)      Assessment:    Healthy female exam.    Plan:    Contraception: NuvaRing vaginal inserts. Mammogram ordered. Follow up in: 2 years.     No Follow-up on file.

## 2016-07-04 LAB — CYTOLOGY - PAP

## 2016-07-05 ENCOUNTER — Encounter: Payer: Self-pay | Admitting: Obstetrics & Gynecology

## 2016-07-05 LAB — LIPID PANEL
CHOL/HDL RATIO: 3 ratio (ref 0.0–4.4)
Cholesterol, Total: 198 mg/dL (ref 100–199)
HDL: 67 mg/dL (ref >39)
LDL Calculated: 101 mg/dL — ABNORMAL HIGH (ref 0–99)
Triglycerides: 151 mg/dL — ABNORMAL HIGH (ref 0–149)
VLDL Cholesterol Cal: 30 mg/dL (ref 5–40)

## 2016-07-05 LAB — TSH: TSH: 1.99 u[IU]/mL (ref 0.450–4.500)

## 2016-07-05 LAB — VITAMIN D 25 HYDROXY (VIT D DEFICIENCY, FRACTURES): VIT D 25 HYDROXY: 30.7 ng/mL (ref 30.0–100.0)

## 2017-01-07 ENCOUNTER — Telehealth: Payer: Self-pay | Admitting: Obstetrics & Gynecology

## 2017-01-07 ENCOUNTER — Other Ambulatory Visit: Payer: Self-pay | Admitting: Obstetrics & Gynecology

## 2017-01-07 MED ORDER — SCOPOLAMINE 1 MG/3DAYS TD PT72: 1.0000 | MEDICATED_PATCH | TRANSDERMAL | 12 refills | Status: DC

## 2017-01-07 NOTE — Telephone Encounter (Signed)
PT called stating that she would like for Dr. Despina Hidden to call in some motion sickness patches, because she is going on a cruise in two weeks. Please contact pt

## 2017-02-05 ENCOUNTER — Other Ambulatory Visit (HOSPITAL_COMMUNITY): Payer: Self-pay | Admitting: Family Medicine

## 2017-02-05 DIAGNOSIS — Z136 Encounter for screening for cardiovascular disorders: Secondary | ICD-10-CM

## 2017-02-06 ENCOUNTER — Ambulatory Visit (HOSPITAL_COMMUNITY)
Admission: RE | Admit: 2017-02-06 | Discharge: 2017-02-06 | Disposition: A | Discharge status: Home / Self Care | Payer: Self-pay | Source: Ambulatory Visit | Attending: Cardiovascular Disease | Admitting: Cardiovascular Disease

## 2017-02-06 DIAGNOSIS — Z0000 Encounter for general adult medical examination without abnormal findings: Secondary | ICD-10-CM

## 2017-02-06 DIAGNOSIS — Z8249 Family history of ischemic heart disease and other diseases of the circulatory system: Secondary | ICD-10-CM

## 2017-02-06 DIAGNOSIS — Z136 Encounter for screening for cardiovascular disorders: Secondary | ICD-10-CM

## 2017-06-05 ENCOUNTER — Encounter: Payer: Self-pay | Admitting: Obstetrics & Gynecology

## 2017-06-05 ENCOUNTER — Other Ambulatory Visit: Payer: Self-pay | Admitting: *Deleted

## 2017-06-06 ENCOUNTER — Other Ambulatory Visit: Payer: Self-pay | Admitting: Obstetrics & Gynecology

## 2017-06-06 MED ORDER — ETONOGESTREL-ETHINYL ESTRADIOL 0.12-0.015 MG/24HR VA RING: VAGINAL_RING | VAGINAL | 12 refills | Status: DC

## 2017-08-27 ENCOUNTER — Other Ambulatory Visit: Payer: Self-pay | Admitting: Adult Health

## 2017-08-27 DIAGNOSIS — Z139 Encounter for screening, unspecified: Secondary | ICD-10-CM

## 2017-10-04 ENCOUNTER — Ambulatory Visit
Admission: RE | Admit: 2017-10-04 | Discharge: 2017-10-04 | Disposition: A | Discharge status: Home / Self Care | Payer: BC Managed Care – PPO | Source: Ambulatory Visit | Attending: Adult Health | Admitting: Adult Health

## 2017-10-04 DIAGNOSIS — Z139 Encounter for screening, unspecified: Secondary | ICD-10-CM

## 2018-02-24 ENCOUNTER — Other Ambulatory Visit: Payer: Self-pay | Admitting: Sports Medicine

## 2018-02-24 DIAGNOSIS — M5126 Other intervertebral disc displacement, lumbar region: Secondary | ICD-10-CM

## 2018-02-25 ENCOUNTER — Ambulatory Visit
Admission: RE | Admit: 2018-02-25 | Discharge: 2018-02-25 | Disposition: A | Discharge status: Home / Self Care | Payer: BC Managed Care – PPO | Source: Ambulatory Visit | Attending: Sports Medicine | Admitting: Sports Medicine

## 2018-02-25 DIAGNOSIS — M5126 Other intervertebral disc displacement, lumbar region: Secondary | ICD-10-CM

## 2018-09-03 ENCOUNTER — Other Ambulatory Visit: Payer: Self-pay | Admitting: Obstetrics & Gynecology

## 2018-09-07 ENCOUNTER — Other Ambulatory Visit: Payer: Self-pay | Admitting: Obstetrics & Gynecology

## 2019-11-09 ENCOUNTER — Other Ambulatory Visit: Payer: Self-pay

## 2019-11-09 ENCOUNTER — Other Ambulatory Visit (HOSPITAL_COMMUNITY)
Admission: RE | Admit: 2019-11-09 | Discharge: 2019-11-09 | Disposition: A | Discharge status: Home / Self Care | Payer: BC Managed Care – PPO | Source: Ambulatory Visit | Attending: Obstetrics & Gynecology | Admitting: Obstetrics & Gynecology

## 2019-11-09 ENCOUNTER — Encounter: Payer: Self-pay | Admitting: Obstetrics & Gynecology

## 2019-11-09 ENCOUNTER — Ambulatory Visit (INDEPENDENT_AMBULATORY_CARE_PROVIDER_SITE_OTHER): Payer: BC Managed Care – PPO | Admitting: Obstetrics & Gynecology

## 2019-11-09 VITALS — BP 126/81 | HR 81 | Ht 63.0 in | Wt 223.0 lb

## 2019-11-09 DIAGNOSIS — Z01419 Encounter for gynecological examination (general) (routine) without abnormal findings: Secondary | ICD-10-CM | POA: Diagnosis present

## 2019-11-09 MED ORDER — ETONOGESTREL-ETHINYL ESTRADIOL 0.12-0.015 MG/24HR VA RING: VAGINAL_RING | VAGINAL | 12 refills | Status: DC

## 2019-11-09 NOTE — Progress Notes (Signed)
Subjective:     Shelby Greene is a 44 y.o. female here for a routine exam.  No LMP recorded. (Menstrual status: Oral contraceptives). No obstetric history on file. Birth Control Method:  nuva ring Menstrual Calendar(currently): regular  Current complaints: none.   Current acute medical issues:  none   Recent Gynecologic History No LMP recorded. (Menstrual status: Oral contraceptives). Last Pap: 2017,  normal Last mammogram: 2020,  Recommended to have yearly  No past medical history on file.  No past surgical history on file.  OB History   No obstetric history on file.     Social History   Socioeconomic History  . Marital status: Married    Spouse name: Not on file  . Number of children: Not on file  . Years of education: Not on file  . Highest education level: Not on file  Occupational History  . Not on file  Tobacco Use  . Smoking status: Never Smoker  . Smokeless tobacco: Never Used  Substance and Sexual Activity  . Alcohol use: No  . Drug use: Not Currently  . Sexual activity: Yes  Other Topics Concern  . Not on file  Social History Narrative  . Not on file   Social Determinants of Health   Financial Resource Strain:   . Difficulty of Paying Living Expenses: Not on file  Food Insecurity:   . Worried About Charity fundraiser in the Last Year: Not on file  . Ran Out of Food in the Last Year: Not on file  Transportation Needs:   . Lack of Transportation (Medical): Not on file  . Lack of Transportation (Non-Medical): Not on file  Physical Activity:   . Days of Exercise per Week: Not on file  . Minutes of Exercise per Session: Not on file  Stress:   . Feeling of Stress : Not on file  Social Connections:   . Frequency of Communication with Friends and Family: Not on file  . Frequency of Social Gatherings with Friends and Family: Not on file  . Attends Religious Services: Not on file  . Active Member of Clubs or Organizations: Not on file  . Attends English as a second language teacher Meetings: Not on file  . Marital Status: Not on file    No family history on file.   Current Outpatient Medications:  .  etonogestrel-ethinyl estradiol (NUVARING) 0.12-0.015 MG/24HR vaginal ring, INSERT 1 RING VAGINALLY AND LEAVE IN PLACE FOR 3 CONSECUTIVE WEEKS, THEN REMOVE FOR 1 WEEK., Disp: 1 each, Rfl: 12 .  scopolamine (TRANSDERM-SCOP, 1.5 MG,) 1 MG/3DAYS, Place 1 patch (1.5 mg total) onto the skin every 3 (three) days. Put first patch on at least 24 hours before leaving on the cruise (Patient not taking: Reported on 11/09/2019), Disp: 10 patch, Rfl: 12  Review of Systems  Review of Systems  Constitutional: Negative for fever, chills, weight loss, malaise/fatigue and diaphoresis.  HENT: Negative for hearing loss, ear pain, nosebleeds, congestion, sore throat, neck pain, tinnitus and ear discharge.   Eyes: Negative for blurred vision, double vision, photophobia, pain, discharge and redness.  Respiratory: Negative for cough, hemoptysis, sputum production, shortness of breath, wheezing and stridor.   Cardiovascular: Negative for chest pain, palpitations, orthopnea, claudication, leg swelling and PND.  Gastrointestinal: negative for abdominal pain. Negative for heartburn, nausea, vomiting, diarrhea, constipation, blood in stool and melena.  Genitourinary: Negative for dysuria, urgency, frequency, hematuria and flank pain.  Musculoskeletal: Negative for myalgias, back pain, joint pain and falls.  Skin: Negative for  itching and rash.  Neurological: Negative for dizziness, tingling, tremors, sensory change, speech change, focal weakness, seizures, loss of consciousness, weakness and headaches.  Endo/Heme/Allergies: Negative for environmental allergies and polydipsia. Does not bruise/bleed easily.  Psychiatric/Behavioral: Negative for depression, suicidal ideas, hallucinations, memory loss and substance abuse. The patient is not nervous/anxious and does not have insomnia.         Objective:  Blood pressure 126/81, pulse 81, height 5\' 3"  (1.6 m), weight 223 lb (101.2 kg).   Physical Exam  Vitals reviewed. Constitutional: She is oriented to person, place, and time. She appears well-developed and well-nourished.  HENT:  Head: Normocephalic and atraumatic.        Right Ear: External ear normal.  Left Ear: External ear normal.  Nose: Nose normal.  Mouth/Throat: Oropharynx is clear and moist.  Eyes: Conjunctivae and EOM are normal. Pupils are equal, round, and reactive to light. Right eye exhibits no discharge. Left eye exhibits no discharge. No scleral icterus.  Neck: Normal range of motion. Neck supple. No tracheal deviation present. No thyromegaly present.  Cardiovascular: Normal rate, regular rhythm, normal heart sounds and intact distal pulses.  Exam reveals no gallop and no friction rub.   No murmur heard. Respiratory: Effort normal and breath sounds normal. No respiratory distress. She has no wheezes. She has no rales. She exhibits no tenderness.  GI: Soft. Bowel sounds are normal. She exhibits no distension and no mass. There is no tenderness. There is no rebound and no guarding.  Genitourinary:  Breasts no masses skin changes or nipple changes bilaterally      Vulva is normal without lesions Vagina is pink moist without discharge Cervix normal in appearance and pap is done Uterus is normal size shape and contour Adnexa is negative with normal sized ovaries   Musculoskeletal: Normal range of motion. She exhibits no edema and no tenderness.  Neurological: She is alert and oriented to person, place, and time. She has normal reflexes. She displays normal reflexes. No cranial nerve deficit. She exhibits normal muscle tone. Coordination normal.  Skin: Skin is warm and dry. No rash noted. No erythema. No pallor.  Psychiatric: She has a normal mood and affect. Her behavior is normal. Judgment and thought content normal.       Medications Ordered at today's  visit: Meds ordered this encounter  Medications  . etonogestrel-ethinyl estradiol (NUVARING) 0.12-0.015 MG/24HR vaginal ring    Sig: INSERT 1 RING VAGINALLY AND LEAVE IN PLACE FOR 3 CONSECUTIVE WEEKS, THEN REMOVE FOR 1 WEEK.    Dispense:  1 each    Refill:  12    Other orders placed at today's visit: No orders of the defined types were placed in this encounter.     Assessment:    Healthy female exam.    Plan:    Contraception: NuvaRing vaginal inserts. Mammogram ordered. Follow up in: 3 years.     No follow-ups on file.

## 2019-11-11 LAB — CYTOLOGY - PAP
Adequacy: ABSENT
Comment: NEGATIVE
Diagnosis: NEGATIVE
High risk HPV: NEGATIVE

## 2020-07-27 ENCOUNTER — Other Ambulatory Visit: Payer: Self-pay | Admitting: Obstetrics & Gynecology

## 2020-07-27 ENCOUNTER — Telehealth: Payer: Self-pay | Admitting: *Deleted

## 2020-07-27 ENCOUNTER — Telehealth: Payer: Self-pay | Admitting: Obstetrics & Gynecology

## 2020-07-27 MED ORDER — FLUCONAZOLE 150 MG PO TABS: ORAL_TABLET | ORAL | 1 refills | Status: DC

## 2020-07-27 NOTE — Telephone Encounter (Signed)
Patient with c/o vaginal itching and thinks she has a yeast infection.  Requesting Diflucan be sent to her pharmacy.

## 2020-07-27 NOTE — Telephone Encounter (Signed)
Patient request fluconizol meds sent to pharmacy. patient states she has yeast infection and would like medication sent to pharmacy. Patient uses [facility] [address] [place].

## 2020-09-25 ENCOUNTER — Telehealth: Payer: Self-pay | Admitting: Emergency Medicine

## 2020-09-25 ENCOUNTER — Ambulatory Visit (INDEPENDENT_AMBULATORY_CARE_PROVIDER_SITE_OTHER): Payer: BC Managed Care – PPO

## 2020-09-25 ENCOUNTER — Ambulatory Visit
Admission: EM | Admit: 2020-09-25 | Discharge: 2020-09-25 | Disposition: A | Discharge status: Home / Self Care | Payer: BC Managed Care – PPO | Attending: Family Medicine | Admitting: Family Medicine

## 2020-09-25 ENCOUNTER — Other Ambulatory Visit: Payer: Self-pay

## 2020-09-25 ENCOUNTER — Encounter: Payer: Self-pay | Admitting: Emergency Medicine

## 2020-09-25 DIAGNOSIS — R062 Wheezing: Secondary | ICD-10-CM

## 2020-09-25 DIAGNOSIS — R059 Cough, unspecified: Secondary | ICD-10-CM

## 2020-09-25 DIAGNOSIS — J22 Unspecified acute lower respiratory infection: Secondary | ICD-10-CM

## 2020-09-25 MED ORDER — FLUCONAZOLE 150 MG PO TABS: 150.0000 mg | ORAL_TABLET | Freq: Every day | ORAL | 0 refills | Status: AC

## 2020-09-25 MED ORDER — AZITHROMYCIN 250 MG PO TABS: 250.0000 mg | ORAL_TABLET | Freq: Every day | ORAL | 0 refills | Status: DC

## 2020-09-25 MED ORDER — ALBUTEROL SULFATE HFA 108 (90 BASE) MCG/ACT IN AERS: 1.0000 | INHALATION_SPRAY | Freq: Four times a day (QID) | RESPIRATORY_TRACT | 0 refills | Status: AC | PRN

## 2020-09-25 MED ORDER — PREDNISONE 20 MG PO TABS: 20.0000 mg | ORAL_TABLET | Freq: Two times a day (BID) | ORAL | 0 refills | Status: DC

## 2020-09-25 NOTE — ED Triage Notes (Signed)
sinus congestion x 2 weeks. chest congestion and some shortness of breath with activity for past week.

## 2020-09-25 NOTE — Discharge Instructions (Addendum)
Make sure you are drinking plenty of water Take the antibiotic per instructions.  2 today then 1 a day until gone Take prednisone 2 times a day for 5 days.  This helps reduce bronchial inflammation Use the albuterol inhaler as needed for wheezing Expect improvement over next 2 to 3 days Exercise caution/quarantine until Covid test is available

## 2020-09-25 NOTE — ED Provider Notes (Signed)
[facility]    CSN: [id number] Arrival date & time: 09/25/20  1243      History   Chief Complaint No chief complaint on file.   HPI Shelby Greene is a 45 y.o. female.   HPI   Patient has had coughing and chest congestion for 2 weeks.  Is localized in her left chest.  Initially she had some sinus drainage and sore throat.  This is gotten better.  She has shortness of breath with activity.  No underlying lung disease.  Does not smoke cigarettes.  She has noted wheezing.  Has never used an inhaler. Is not Covid vaccinated Has not been exposed to Covid  History reviewed. No pertinent past medical history.  There are no problems to display for this patient.   History reviewed. No pertinent surgical history.  OB History   No obstetric history on file.      Home Medications    Prior to Admission medications   Medication Sig Start Date End Date Taking? Authorizing Provider  albuterol (VENTOLIN HFA) 108 (90 Base) MCG/ACT inhaler Inhale 1-2 puffs into the lungs every 6 (six) hours as needed for wheezing or shortness of breath. 09/25/20  Yes Raylene Everts, MD  azithromycin (ZITHROMAX) 250 MG tablet Take 1 tablet (250 mg total) by mouth daily. Take first 2 tablets together, then 1 every day until finished. 09/25/20  Yes Raylene Everts, MD  etonogestrel-ethinyl estradiol (NUVARING) 0.12-0.015 MG/24HR vaginal ring INSERT 1 RING VAGINALLY AND LEAVE IN PLACE FOR 3 CONSECUTIVE WEEKS, THEN REMOVE FOR 1 WEEK. 11/09/19  Yes Florian Buff, MD  predniSONE (DELTASONE) 20 MG tablet Take 1 tablet (20 mg total) by mouth 2 (two) times daily with a meal. 09/25/20  Yes Raylene Everts, MD    Family History No family history on file.  Social History Social History   Tobacco Use  . Smoking status: Never Smoker  . Smokeless tobacco: Never Used  Vaping Use  . Vaping Use: Never used  Substance Use Topics  . Alcohol use: No  . Drug use: Not Currently     Allergies    Morphine and related   Review of Systems Review of Systems See HPI  Physical Exam Triage Vital Signs ED Triage Vitals  Enc Vitals Group     BP 09/25/20 1329 124/85     Pulse Rate 09/25/20 1329 82     Resp 09/25/20 1329 15     Temp 09/25/20 1329 98.2 F (36.8 C)     Temp src --      SpO2 09/25/20 1329 97 %     Weight 09/25/20 1338 215 lb (97.5 kg)     Height 09/25/20 1338 5\' 4"  (1.626 m)     Head Circumference --      Peak Flow --      Pain Score 09/25/20 1338 0     Pain Loc --      Pain Edu? --      Excl. in [place]? --    No data found.  Updated Vital Signs BP 124/85   Pulse 82   Temp 98.2 F (36.8 C)   Resp 15   Ht 5\' 4"  (1.626 m)   Wt 97.5 kg   SpO2 97%   BMI 36.90 kg/m      Physical Exam Constitutional:      General: She is not in acute distress.    Appearance: She is well-developed and well-nourished.     Comments: Overweight.  Appears tired  HENT:     Head: Normocephalic and atraumatic.     Nose:     Comments: Mask is in place    Mouth/Throat:     Mouth: Oropharynx is clear and moist.  Eyes:     Conjunctiva/sclera: Conjunctivae normal.     Pupils: Pupils are equal, round, and reactive to light.  Cardiovascular:     Rate and Rhythm: Normal rate and regular rhythm.     Heart sounds: Normal heart sounds.  Pulmonary:     Effort: Pulmonary effort is normal. No respiratory distress.     Breath sounds: Wheezing present. No rales.     Comments: Wheezing in left chest only Abdominal:     General: There is no distension.     Palpations: Abdomen is soft.  Musculoskeletal:        General: No edema. Normal range of motion.     Cervical back: Normal range of motion.  Skin:    General: Skin is warm and dry.  Neurological:     Mental Status: She is alert.  Psychiatric:        Behavior: Behavior normal.      UC Treatments / Results  Labs (all labs ordered are listed, but only abnormal results are displayed) Labs Reviewed  NOVEL CORONAVIRUS, NAA     EKG   Radiology DG Chest 2 View  Result Date: 09/25/2020 CLINICAL DATA:  Cough and wheezing for 2 weeks. EXAM: CHEST - 2 VIEW COMPARISON:  August 23, 2011 FINDINGS: The heart size and mediastinal contours are within normal limits. Both lungs are clear. The visualized skeletal structures are unremarkable. IMPRESSION: No active cardiopulmonary disease. Electronically Signed   By: Sherian Rein M.D.   On: 09/25/2020 14:21    Procedures Procedures (including critical care time)  Medications Ordered in UC Medications - No data to display  Initial Impression / Assessment and Plan / UC Course  I have reviewed the triage vital signs and the nursing notes.  Pertinent labs & imaging results that were available during my care of the patient were reviewed by me and considered in my medical decision making (see chart for details).     We will treat for viral bronchitis.  Symptoms lasted longer than 2 weeks x 1 and an antibiotic.  Covid test is still pending. Final Clinical Impressions(s) / UC Diagnoses   Final diagnoses:  Cough  LRTI (lower respiratory tract infection)     Discharge Instructions     Make sure you are drinking plenty of water Take the antibiotic per instructions.  2 today then 1 a day until gone Take prednisone 2 times a day for 5 days.  This helps reduce bronchial inflammation Use the albuterol inhaler as needed for wheezing Expect improvement over next 2 to 3 days Exercise caution/quarantine until Covid test is available     ED Prescriptions    Medication Sig Dispense Auth. Provider   albuterol (VENTOLIN HFA) 108 (90 Base) MCG/ACT inhaler Inhale 1-2 puffs into the lungs every 6 (six) hours as needed for wheezing or shortness of breath. 18 g Eustace Moore, MD   azithromycin (ZITHROMAX) 250 MG tablet Take 1 tablet (250 mg total) by mouth daily. Take first 2 tablets together, then 1 every day until finished. 6 tablet Eustace Moore, MD   predniSONE  (DELTASONE) 20 MG tablet Take 1 tablet (20 mg total) by mouth 2 (two) times daily with a meal. 10 tablet Eustace Moore, MD  PDMP not reviewed this encounter.   Eustace Moore, MD 09/25/20 1452

## 2020-09-27 LAB — NOVEL CORONAVIRUS, NAA: SARS-CoV-2, NAA: NOT DETECTED

## 2020-09-27 LAB — SARS-COV-2, NAA 2 DAY TAT

## 2020-12-23 HISTORY — PX: SHOULDER SURGERY: SHX246

## 2020-12-30 ENCOUNTER — Other Ambulatory Visit: Payer: Self-pay

## 2020-12-30 MED ORDER — ETONOGESTREL-ETHINYL ESTRADIOL 0.12-0.015 MG/24HR VA RING: VAGINAL_RING | VAGINAL | 12 refills | Status: DC

## 2021-01-09 ENCOUNTER — Other Ambulatory Visit: Payer: Self-pay

## 2021-02-07 ENCOUNTER — Encounter: Payer: Self-pay | Admitting: Adult Health

## 2021-02-07 ENCOUNTER — Other Ambulatory Visit: Payer: Self-pay

## 2021-02-07 ENCOUNTER — Ambulatory Visit (INDEPENDENT_AMBULATORY_CARE_PROVIDER_SITE_OTHER): Payer: BC Managed Care – PPO | Admitting: Adult Health

## 2021-02-07 VITALS — BP 138/90 | HR 87 | Ht 63.0 in | Wt 230.0 lb

## 2021-02-07 DIAGNOSIS — N7689 Other specified inflammation of vagina and vulva: Secondary | ICD-10-CM | POA: Diagnosis not present

## 2021-02-07 DIAGNOSIS — Z1231 Encounter for screening mammogram for malignant neoplasm of breast: Secondary | ICD-10-CM | POA: Insufficient documentation

## 2021-02-07 DIAGNOSIS — Z01419 Encounter for gynecological examination (general) (routine) without abnormal findings: Secondary | ICD-10-CM | POA: Insufficient documentation

## 2021-02-07 NOTE — Progress Notes (Signed)
  Subjective:     Patient ID: Shelby Greene, female   DOB: 09-15-1976, 45 y.o.   MRN: [id number]  HPI Marta is a 45 year old white female, married, G1P1,in complaining of ?lump in vagina, noticed Friday after sex. Has had right shoulder surgery,in sling. PCP is [facility] [place].   Review of Systems ?felt lump, or swollen area in vagina Friday after sex, when putting ring in Reviewed past medical,surgical, social and family history. Reviewed medications and allergies.     Objective:   Physical Exam BP 138/90 (BP Location: Left Arm, Patient Position: Sitting, Cuff Size: Large)   Pulse 87   Ht 5\' 3"  (1.6 m)   Wt 230 lb (104.3 kg)   BMI 40.74 kg/m    Skin warm and dry.Pelvic: external genitalia is normal in appearance no lesions, vagina: pink with good moisture, no lumps or masses seen or felt,urethra has no lesions or masses noted, cervix:smooth and bulbous, uterus: normal size, shape and contour, non tender, no masses felt, adnexa: no masses or tenderness noted. Bladder is non tender and no masses felt.   [place] - 02/07/21 1413      Pregnancy Intention Screening   Does the patient want to become pregnant in the next year? No    Does the patient's partner want to become pregnant in the next year? No    Would the patient like to discuss contraceptive options today? No      Contraception Wrap Up   Current Method Vaginal Ring    End Method Vaginal Ring    Contraception Counseling Provided No         Examination chaperoned by Tish RN  Assessment:     1. Screening mammogram for breast cancer Pt to call for appt for screening mammogram  - MM 3D SCREEN BREAST BILATERAL; Future  2. Normal vaginal exam     Plan:     Follow up prn

## 2021-06-14 ENCOUNTER — Ambulatory Visit
Admission: RE | Admit: 2021-06-14 | Discharge: 2021-06-14 | Disposition: A | Discharge status: Home / Self Care | Payer: BC Managed Care – PPO | Source: Ambulatory Visit | Attending: Adult Health | Admitting: Adult Health

## 2021-06-14 ENCOUNTER — Other Ambulatory Visit: Payer: Self-pay

## 2021-06-14 DIAGNOSIS — Z1231 Encounter for screening mammogram for malignant neoplasm of breast: Secondary | ICD-10-CM

## 2021-06-28 DIAGNOSIS — R000 Tachycardia, unspecified: Secondary | ICD-10-CM | POA: Insufficient documentation

## 2021-06-28 NOTE — Progress Notes (Signed)
Patient referred by Josefa Half* for tachycardia  Subjective:   Shelby Greene, female    DOB: June 17, 1976, 45 y.o.   MRN: [id number]   Chief Complaint  Patient presents with   Tachycardia   New Patient (Initial Visit)     HPI  45 y.o.  female with family history of early CAD, now with palpitations and chest pain symptoms.  Patient works in a [employer], lives fairly sedentary lifestyle.  Her father has had multiple MIs and strokes, first MI in 71s.  Patient recently has experienced episodes of fast heart rate upwards of 100 bpm while at rest.  This is noticed on her apple watch.  Separately, she is also had episodes of chest pain lasting for few seconds at a time.  She denies any shortness of breath, presyncope, syncope symptoms.   History reviewed. No pertinent past medical history.   Past Surgical History:  Procedure Laterality Date   CESAREAN SECTION     SHOULDER SURGERY  12/2020     Social History   Tobacco Use  Smoking Status Never  Smokeless Tobacco Never    Social History   Substance and Sexual Activity  Alcohol Use No     Family History  Problem Relation Age of Onset   Heart disease Paternal Grandfather    Colon cancer Paternal Grandmother    Colon cancer Maternal Grandmother      Current Outpatient Medications on File Prior to Visit  Medication Sig Dispense Refill   ACETAMINOPHEN EXTRA STRENGTH 500 MG capsule SMARTSIG:2 Capsule(s) By Mouth Every 8 Hours PRN     albuterol (VENTOLIN HFA) 108 (90 Base) MCG/ACT inhaler Inhale 1-2 puffs into the lungs every 6 (six) hours as needed for wheezing or shortness of breath. 18 g 0   cetirizine (ZYRTEC) 10 MG tablet Take by mouth.     diclofenac (VOLTAREN) 50 MG EC tablet Take 50 mg by mouth 2 (two) times daily.     etonogestrel-ethinyl estradiol (NUVARING) 0.12-0.015 MG/24HR vaginal ring INSERT 1 RING VAGINALLY AND LEAVE IN PLACE FOR 3 CONSECUTIVE WEEKS, THEN REMOVE FOR 1 WEEK. 1 each 12    methocarbamol (ROBAXIN) 750 MG tablet Take 750 mg by mouth every 8 (eight) hours as needed.     predniSONE (DELTASONE) 20 MG tablet Take 1 tablet (20 mg total) by mouth 2 (two) times daily with a meal. 10 tablet 0   sertraline (ZOLOFT) 50 MG tablet Take 25 tablets by mouth daily.     No current facility-administered medications on file prior to visit.    Cardiovascular and other pertinent studies:  EKG 06/29/2021: Sinus rhythm 93 bpm Normal EKG   Recent labs: 05/24/2021: Glucose 86, BUN/Cr 11/0.68. EGFR >90. Na/K 140/4. Rest of the CMP normal H/H 14/42. MCV 82. Platelets 281 Chol 169, TG 162, HDL 57, LDL 96 HbA1C N/A TSH N/A    Review of Systems  Cardiovascular:  Positive for chest pain and palpitations. Negative for dyspnea on exertion, leg swelling and syncope.        Vitals:   06/29/21 0854  BP: 129/86  Pulse: 91  Resp: 16  Temp: 98 F (36.7 C)  SpO2: 97%     Body mass index is 41.27 kg/m. Filed Weights   06/29/21 0854  Weight: 233 lb (105.7 kg)     Objective:   Physical Exam Vitals and nursing note reviewed.  Constitutional:      General: She is not in acute distress. Neck:     Vascular:  No JVD.  Cardiovascular:     Rate and Rhythm: Normal rate and regular rhythm.     Heart sounds: Normal heart sounds. No murmur heard. Pulmonary:     Effort: Pulmonary effort is normal.     Breath sounds: Normal breath sounds. No wheezing or rales.  Musculoskeletal:     Right lower leg: No edema.     Left lower leg: No edema.          Assessment & Recommendations:    45 y.o.  female with family history of early CAD, now with palpitations and chest pain symptoms.  Tachycardia, palpitations: Will enroll in cardio logs to monitor her apple watch tracings to evaluate for any tachyarrhythmias.  Discussed vagal maneuvers.  Chest pain: Atypical.  Will perform exercise treadmill stress test and obtain CT cardiac scoring given her family history of early  CAD.  Further recommendations after above testing.   Thank you for referring the patient to [place]. Please feel free to contact with any questions.   Nigel Mormon, MD Pager: [phone] Office: [phone]

## 2021-06-29 ENCOUNTER — Other Ambulatory Visit: Payer: Self-pay

## 2021-06-29 ENCOUNTER — Ambulatory Visit: Payer: BC Managed Care – PPO | Admitting: Cardiology

## 2021-06-29 ENCOUNTER — Encounter: Payer: Self-pay | Admitting: Cardiology

## 2021-06-29 VITALS — BP 129/86 | HR 91 | Temp 98.0°F | Resp 16 | Ht 63.0 in | Wt 233.0 lb

## 2021-06-29 DIAGNOSIS — R0789 Other chest pain: Secondary | ICD-10-CM

## 2021-06-29 DIAGNOSIS — R000 Tachycardia, unspecified: Secondary | ICD-10-CM

## 2021-06-29 DIAGNOSIS — Z8249 Family history of ischemic heart disease and other diseases of the circulatory system: Secondary | ICD-10-CM

## 2021-07-12 ENCOUNTER — Ambulatory Visit (INDEPENDENT_AMBULATORY_CARE_PROVIDER_SITE_OTHER): Payer: BC Managed Care – PPO | Admitting: Podiatry

## 2021-07-12 ENCOUNTER — Ambulatory Visit (INDEPENDENT_AMBULATORY_CARE_PROVIDER_SITE_OTHER): Payer: BC Managed Care – PPO

## 2021-07-12 ENCOUNTER — Other Ambulatory Visit: Payer: Self-pay

## 2021-07-12 DIAGNOSIS — S9000XA Contusion of unspecified ankle, initial encounter: Secondary | ICD-10-CM

## 2021-07-12 DIAGNOSIS — S9001XA Contusion of right ankle, initial encounter: Secondary | ICD-10-CM | POA: Diagnosis not present

## 2021-07-12 DIAGNOSIS — S93402A Sprain of unspecified ligament of left ankle, initial encounter: Secondary | ICD-10-CM

## 2021-07-12 MED ORDER — OXYCODONE HCL 5 MG PO TABS: 5.0000 mg | ORAL_TABLET | ORAL | 0 refills | Status: DC | PRN

## 2021-07-17 ENCOUNTER — Other Ambulatory Visit: Payer: BC Managed Care – PPO

## 2021-07-21 NOTE — Progress Notes (Signed)
   HPI: 45 y.o. female presenting today as a new patient for evaluation of the left ankle sprain.  Patient believes she possibly broke her ankle.  She states that she fell in her driveway just an hour before presentation.  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  She says the pain is 10/10.  Presenting today with her husband  No past medical history on file.   Physical Exam: General: The patient is alert and oriented x3 in no acute distress.  Dermatology: Skin is warm, dry and supple bilateral lower extremities. Negative for open lesions or macerations.  Vascular: Palpable pedal pulses bilaterally.  Capillary refill within normal limits.  Moderate edema noted with some mild ecchymosis to the lateral aspect of the foot  Neurological: Epicritic and protective threshold grossly intact bilaterally.   Musculoskeletal Exam: Overall there is gross alignment of the foot and ankle.  No abnormality seen visually  Radiographic Exam:  Normal osseous mineralization. Joint spaces preserved. No fracture/dislocation/boney destruction.  No evidence of fracture  Assessment: 1.  Moderate/severe ankle sprain left   Plan of Care:  1. Patient evaluated. X-Rays reviewed.  2.  Pursue conservative treatments including rest ice compression and elevation 3.  Ace wrap applied.  Recommend Ace wrap daily 4.  Cam boot dispensed.  Weightbearing as tolerated 5.  Prescription for oxycodone 5 mg as needed severe pain  6.  Return to clinic in 4 weeks      Felecia Shelling, DPM [facility]  Dr. Felecia Shelling, DPM    [address] [place], [place] [address]                Office [phone]  Fax [phone]

## 2021-08-07 ENCOUNTER — Ambulatory Visit
Admission: RE | Admit: 2021-08-07 | Discharge: 2021-08-07 | Disposition: A | Discharge status: Home / Self Care | Payer: No Typology Code available for payment source | Source: Ambulatory Visit | Attending: Cardiology | Admitting: Cardiology

## 2021-08-07 DIAGNOSIS — Z8249 Family history of ischemic heart disease and other diseases of the circulatory system: Secondary | ICD-10-CM

## 2021-08-07 DIAGNOSIS — R0789 Other chest pain: Secondary | ICD-10-CM

## 2021-08-14 ENCOUNTER — Ambulatory Visit: Payer: BC Managed Care – PPO | Admitting: Cardiology

## 2021-08-14 ENCOUNTER — Other Ambulatory Visit: Payer: Self-pay

## 2021-08-14 ENCOUNTER — Ambulatory Visit: Payer: BC Managed Care – PPO

## 2021-08-14 DIAGNOSIS — Z8249 Family history of ischemic heart disease and other diseases of the circulatory system: Secondary | ICD-10-CM

## 2021-08-14 DIAGNOSIS — R0789 Other chest pain: Secondary | ICD-10-CM | POA: Insufficient documentation

## 2021-08-14 NOTE — Progress Notes (Signed)
Error

## 2021-08-16 ENCOUNTER — Encounter: Payer: Self-pay | Admitting: Student

## 2021-08-16 ENCOUNTER — Other Ambulatory Visit: Payer: Self-pay

## 2021-08-16 ENCOUNTER — Ambulatory Visit: Payer: BC Managed Care – PPO | Admitting: Student

## 2021-08-16 VITALS — BP 127/82 | HR 92 | Temp 98.5°F | Ht 63.0 in | Wt 224.0 lb

## 2021-08-16 DIAGNOSIS — R0789 Other chest pain: Secondary | ICD-10-CM

## 2021-08-16 DIAGNOSIS — R002 Palpitations: Secondary | ICD-10-CM

## 2021-08-16 DIAGNOSIS — R000 Tachycardia, unspecified: Secondary | ICD-10-CM

## 2021-08-16 MED ORDER — METOPROLOL SUCCINATE ER 25 MG PO TB24: 25.0000 mg | ORAL_TABLET | Freq: Every day | ORAL | 3 refills | Status: DC

## 2021-08-16 NOTE — Progress Notes (Signed)
Patient referred by Josefa Half* for tachycardia  Subjective:   Fredrich Birks, female    DOB: Nov 29, 1975, 45 y.o.   MRN: [id number]   Chief Complaint  Patient presents with   Chest Pain   Follow-up   Results     HPI  45 y.o.  female with family history of early CAD, referred for evaluation of palpitations and chest pain symptoms.  Given patient's symptoms of palpitation she is enrolled in cardio logs monitoring, which has revealed no significant cardiac arrhythmias, just sinus rhythm.  Also due to chest pain symptoms patient underwent exercise stress test and coronary calcium scoring.  Total coronary calcium score was 0 and stress test noted low exercise capacity with EKG changes equivocal for ischemia.  Patient reports she continues to have intermittent episodes of palpitations and chest discomfort.  Denies dyspnea, syncope, near syncope.   Current Outpatient Medications on File Prior to Visit  Medication Sig Dispense Refill   albuterol (VENTOLIN HFA) 108 (90 Base) MCG/ACT inhaler Inhale 1-2 puffs into the lungs every 6 (six) hours as needed for wheezing or shortness of breath. 18 g 0   cetirizine (ZYRTEC) 10 MG tablet Take by mouth.     etonogestrel-ethinyl estradiol (NUVARING) 0.12-0.015 MG/24HR vaginal ring INSERT 1 RING VAGINALLY AND LEAVE IN PLACE FOR 3 CONSECUTIVE WEEKS, THEN REMOVE FOR 1 WEEK. 1 each 12   Semaglutide,0.25 or 0.5MG/DOS, 2 MG/1.5ML SOPN Inject into the skin.     sertraline (ZOLOFT) 50 MG tablet Take 25 tablets by mouth daily.     No current facility-administered medications on file prior to visit.    Cardiovascular and other pertinent studies: Exercise treadmill stress test 08/14/2021: Exercise treadmill stress test performed using Bruce protocol.  Patient reached 4.6 METS, and 91% of age predicted maximum heart rate.  Exercise capacity was low.  No chest pain reported. Normal heart rate and hemodynamic response.  Stress EKG showed sinus  tachycardia, <1 mm upsloping ST depression leads II, III, aVF, normalize 2 min into recovery. EKG changes are equivocal for ischemia.   Recommend clinical correlation.  CT cardiac scoring 08/07/2021: Coronary calcium score of 0.  EKG 06/29/2021: Sinus rhythm 93 bpm Normal EKG   Recent labs: 05/24/2021: Glucose 86, BUN/Cr 11/0.68. EGFR >90. Na/K 140/4. Rest of the CMP normal H/H 14/42. MCV 82. Platelets 281 Chol 169, TG 162, HDL 57, LDL 96 HbA1C N/A TSH N/A    Review of Systems  Cardiovascular:  Positive for chest pain (stable) and palpitations (stable). Negative for claudication, dyspnea on exertion, leg swelling, near-syncope, orthopnea, paroxysmal nocturnal dyspnea and syncope.  Neurological:  Negative for dizziness.        Vitals:   08/16/21 1031  BP: 127/82  Pulse: 92  Temp: 98.5 F (36.9 C)  SpO2: 98%     Body mass index is 39.68 kg/m. Filed Weights   08/16/21 1031  Weight: 224 lb (101.6 kg)     Objective:   Physical Exam Vitals and nursing note reviewed.  Constitutional:      General: She is not in acute distress. Neck:     Vascular: No JVD.  Cardiovascular:     Rate and Rhythm: Normal rate and regular rhythm.     Heart sounds: Normal heart sounds. No murmur heard. Pulmonary:     Effort: Pulmonary effort is normal.     Breath sounds: Normal breath sounds. No wheezing or rales.  Musculoskeletal:     Right lower leg: No edema.  Left lower leg: No edema.  Physical exam unchanged compared to previous office visit.     Assessment & Recommendations:   45 y.o. female with family history of early CAD, now with palpitations and chest pain symptoms.  Tachycardia, palpitations:  No significant cardiac arrhythmias noted on cardio logs for review Again discussed vagal maneuvers Patient continues to have palpitations which are bothersome, therefore recommend low-dose beta-blocker therapy. Start Toprol-XL 25 mg p.o. daily. Counseled patient regarding  side effects, she will notify our office if any issues with initiation of beta-blocker therapy.  Chest pain: Intermittent atypical episodes Reviewed and discussed at length with patient coronary calcium scoring and stress test.  Given coronary calcium score of 0 do not feel further ischemic evaluation is indicated despite EKG and stress test equivocal for ischemia. Advised patient to increase physical activity and focus on diet and lifestyle modifications, particularly weight loss.   Follow-up in 6 months, sooner if needed, for palpitations.  If patient is stable at that time could consider as needed follow-up.   Alethia Berthold, PA-C 08/16/2021, 11:00 AM Office: [phone]

## 2021-11-06 ENCOUNTER — Ambulatory Visit: Payer: BC Managed Care – PPO | Admitting: Podiatry

## 2021-11-08 ENCOUNTER — Other Ambulatory Visit: Payer: Self-pay

## 2021-11-08 ENCOUNTER — Ambulatory Visit: Payer: BC Managed Care – PPO | Admitting: Podiatry

## 2021-11-08 ENCOUNTER — Ambulatory Visit (INDEPENDENT_AMBULATORY_CARE_PROVIDER_SITE_OTHER): Payer: BC Managed Care – PPO

## 2021-11-08 DIAGNOSIS — M79671 Pain in right foot: Secondary | ICD-10-CM

## 2021-11-08 DIAGNOSIS — M7751 Other enthesopathy of right foot: Secondary | ICD-10-CM | POA: Diagnosis not present

## 2021-11-08 DIAGNOSIS — S93402A Sprain of unspecified ligament of left ankle, initial encounter: Secondary | ICD-10-CM

## 2021-11-08 DIAGNOSIS — M7752 Other enthesopathy of left foot: Secondary | ICD-10-CM

## 2021-11-08 NOTE — Progress Notes (Signed)
° °  HPI: 46 y.o. female presenting today for follow-up evaluation regarding left ankle pain secondary to an ankle injury that was sustained on 07/12/2021.  Patient sustained a moderate to severe ankle sprain of the left ankle when she fell in her driveway.  Patient was placed in a immobilization cam boot and she has been going to physical therapy for the past 6 weeks.  She continues to have some pain and tenderness to the left ankle.  Patient also states that when she fell she sustained an injury to the right second toe.  She is concerned that possibly she broke the toe.  It has been painful and achy ever since the injury.  Aggravated by walking in close toed shoes.  She presents for further treatment and evaluation  No past medical history on file.  Past Surgical History:  Procedure Laterality Date   CESAREAN SECTION     SHOULDER SURGERY  12/2020    Allergies  Allergen Reactions   Morphine And Related Hives    Hives    Short Ragweed Pollen Ext Hives    Other reaction(s): Wheezing (ALLERGY/intolerance)     Physical Exam: General: The patient is alert and oriented x3 in no acute distress.  Dermatology: Skin is warm, dry and supple bilateral lower extremities. Negative for open lesions or macerations.  Vascular: Palpable pedal pulses bilaterally. Capillary refill within normal limits.  Negative for any significant edema or erythema  Neurological: Light touch and protective threshold grossly intact  Musculoskeletal Exam: No pedal deformities noted.  There is some tenderness to palpation to the right second toe.  There is also tenderness palpation to the lateral aspect of the left ankle joint  Radiographic Exam:  Normal osseous mineralization. Joint spaces preserved. No fracture/dislocation/boney destruction.  Tibiotalar joint intact.  No cortical irregularities noted.  Assessment: 1. H/o ankle sprain left 07/12/2021 2.  Capsulitis right second toe secondary to injury   Plan of  Care:  1. Patient evaluated. X-Rays reviewed.  2.  In regards to the right second toe the patient may have sustained a fracture of the toe at the time of injury.  X-rays today are negative for any acute fracture.  And there is decent alignment of the toe 3.  For the left ankle, I did recommend steroid injection at this time.  The patient is now 4 months after the date of injury and continues to have some slight tenderness to the lateral aspect of the left ankle.  Injection of 0.5 cc Celestone Soluspan injection the lateral aspect of the left ankle joint 4.  Continue physical therapy as needed 5.  Return to clinic in 4 weeks, if the patient is not significantly improved we may go ahead and order MRI of the left ankle joint      Felecia Shelling, DPM [facility]  Dr. Felecia Shelling, DPM    [address] [place], [place] [address]                Office [phone]  Fax [phone]

## 2021-11-21 ENCOUNTER — Other Ambulatory Visit: Payer: Self-pay | Admitting: Podiatry

## 2021-11-21 DIAGNOSIS — M7751 Other enthesopathy of right foot: Secondary | ICD-10-CM

## 2021-12-19 ENCOUNTER — Other Ambulatory Visit: Payer: Self-pay | Admitting: Student

## 2022-01-12 IMAGING — CT CT CARDIAC CORONARY ARTERY CALCIUM SCORE
3 series · 14 of 20 positions shown, 16 images · non-contrast
Comparison: None.

CLINICAL DATA: 45-year-old Caucasian female with family history of
heart disease.

EXAM:
CT CARDIAC CORONARY ARTERY CALCIUM SCORE
TECHNIQUE: Non-contrast imaging through the heart was performed using
prospective ECG gating. Image post processing was performed on an
independent workstation, allowing for quantitative analysis of the
heart and coronary arteries. Note that this exam targets the heart
and the chest was not imaged in its entirety.

[Series 2: calcium scoring 2.00 qr36 bestdiast 69% hrt calciu · axial · 0.39mm/px · z∈[+1758,+1842]mm · 4 of 70 slices shown]
[im 14/70  vessel]
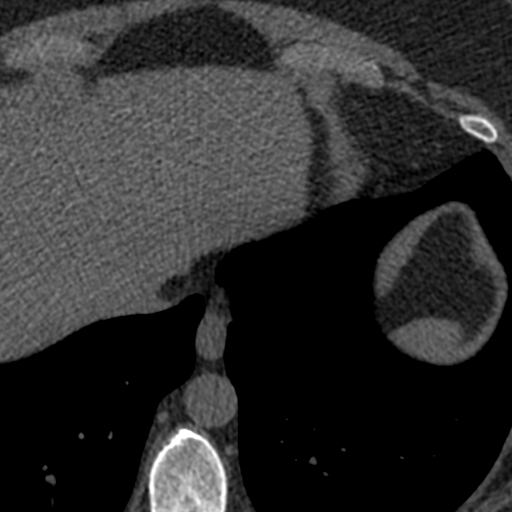
[im 28/70  vessel]
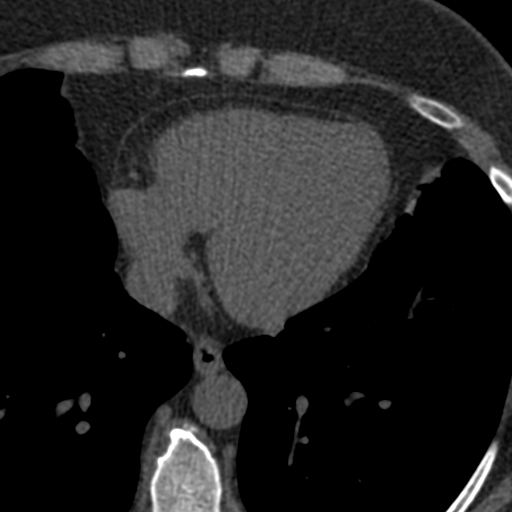
[im 42/70  vessel]
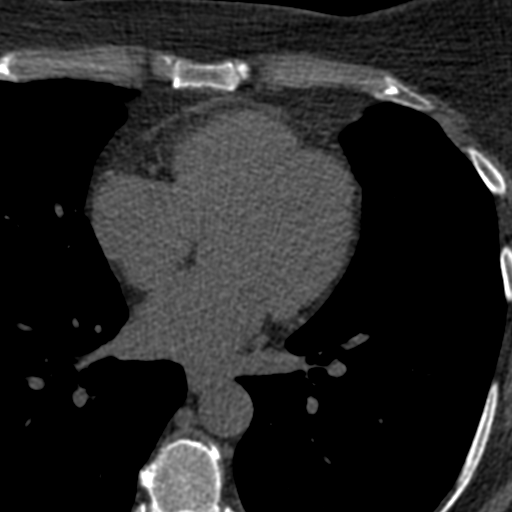
[im 56/70  vessel]
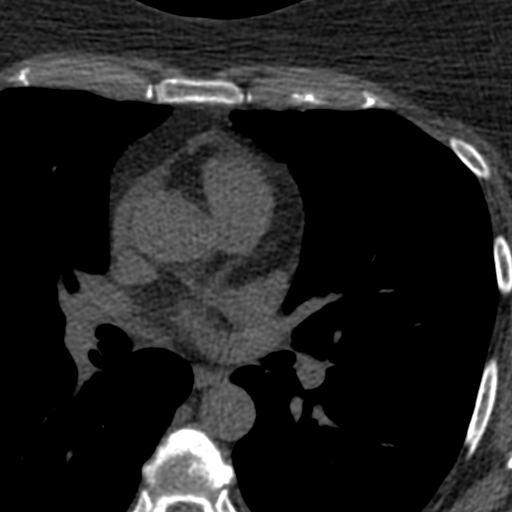

[Series 3: calcium scoring 2.00 br40 bestdiast 69% axial · axial · 0.64mm/px · z∈[+1754,+1846]mm · 5 of 70 slices shown, 7 images]
[im 12/70  vessel]
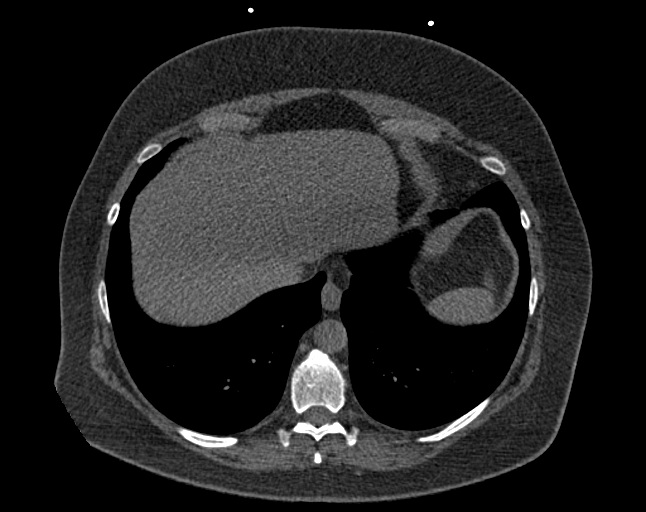
[im 12/70  lung]
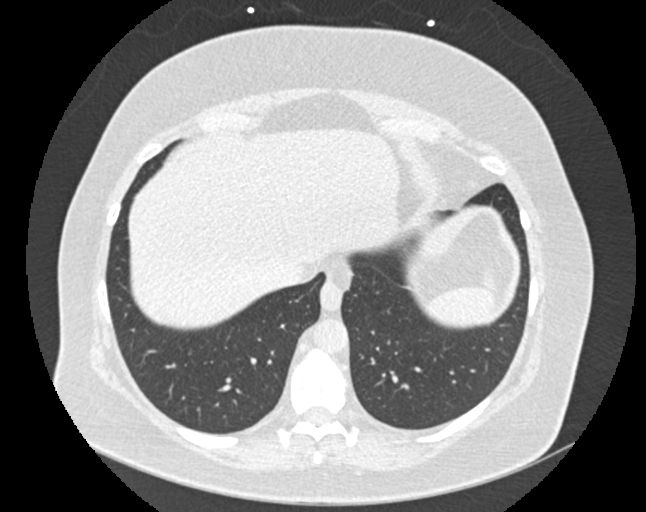
[im 24/70  vessel]
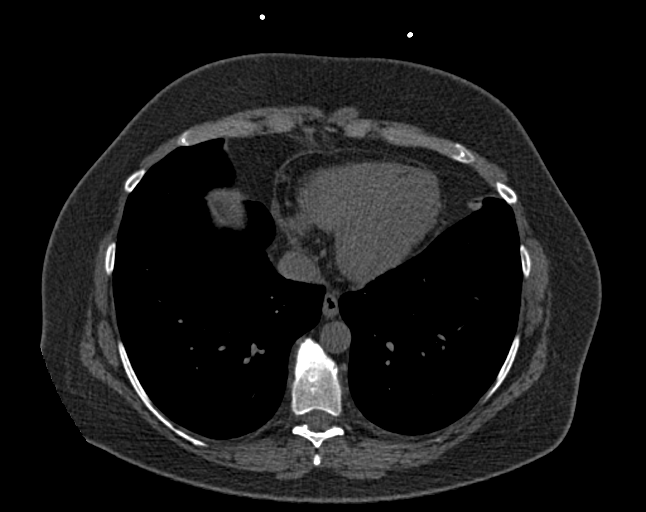
[im 35/70  vessel]
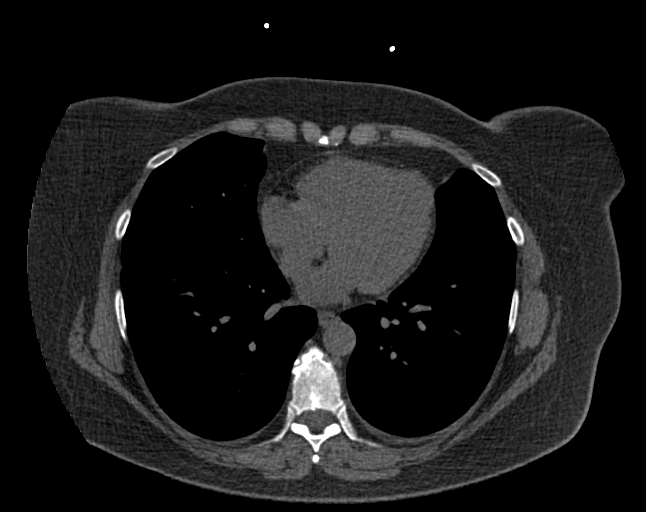
[im 47/70  vessel]
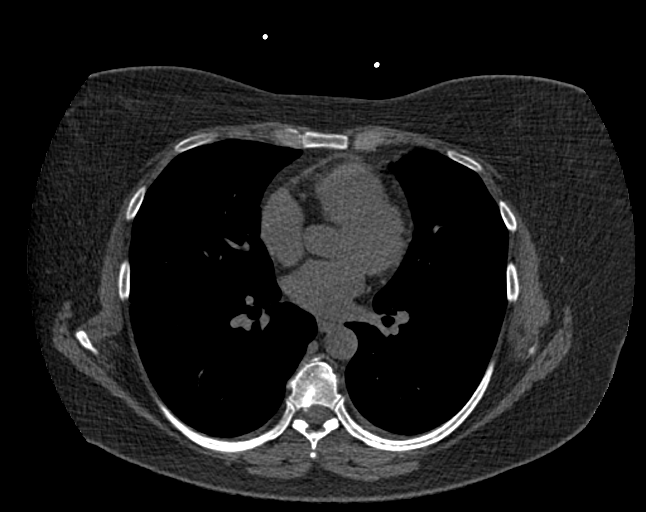
[im 58/70  vessel]
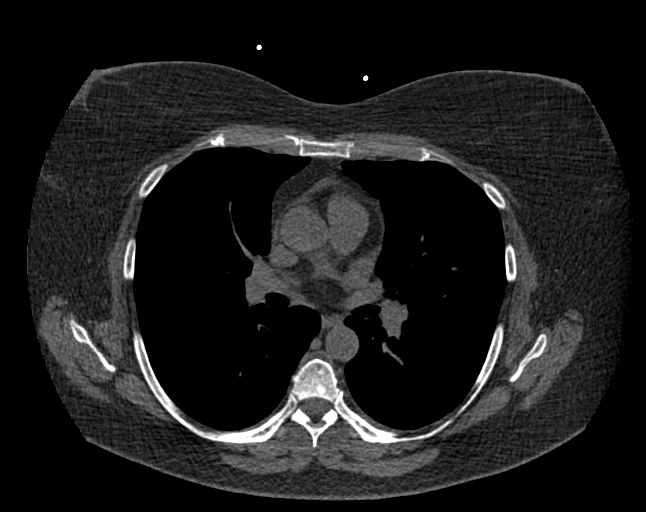
[im 58/70  lung]
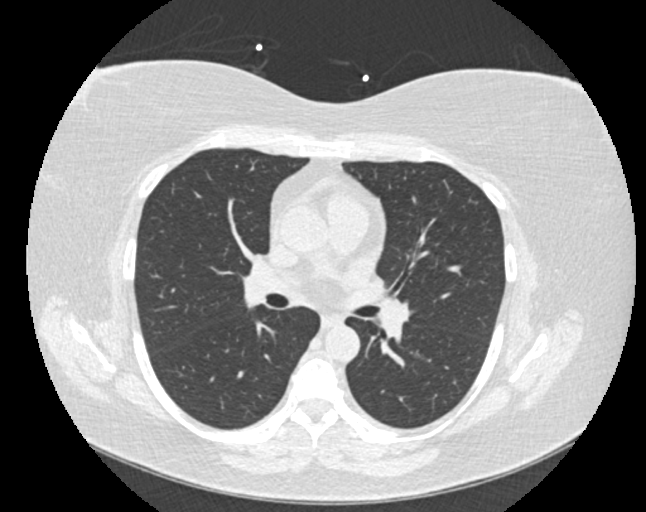

[Series 9: calcium scoring 2.00 br60 bestdiast 69% lungs · axial · 0.64mm/px · z∈[+1754,+1846]mm · 5 of 70 slices shown]
[im 12/70  vessel]
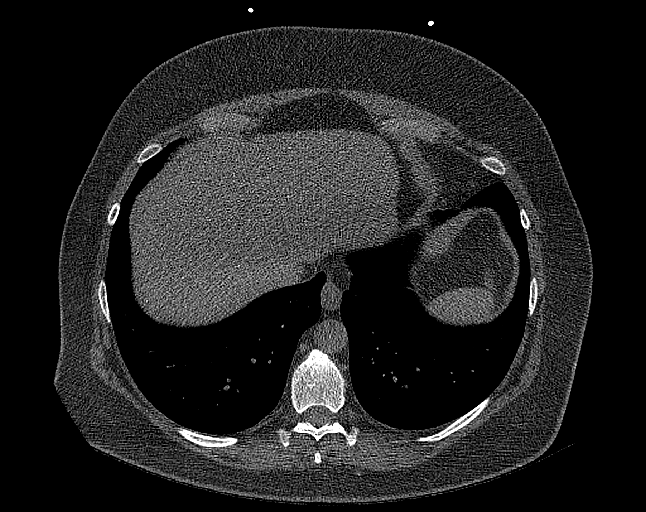
[im 24/70  vessel]
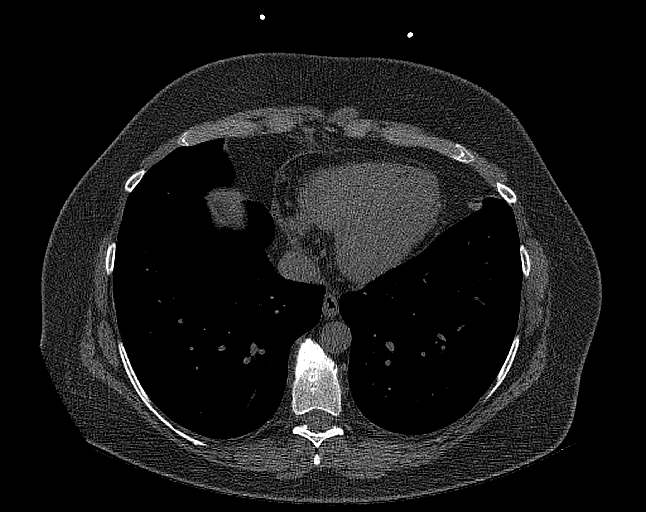
[im 35/70  vessel]
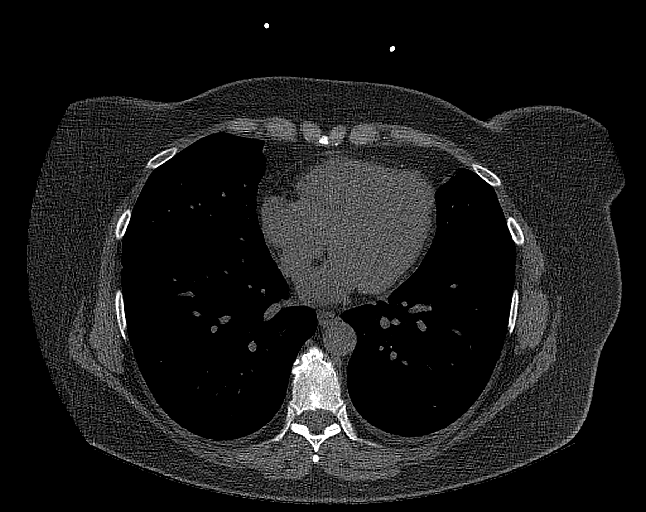
[im 47/70  vessel]
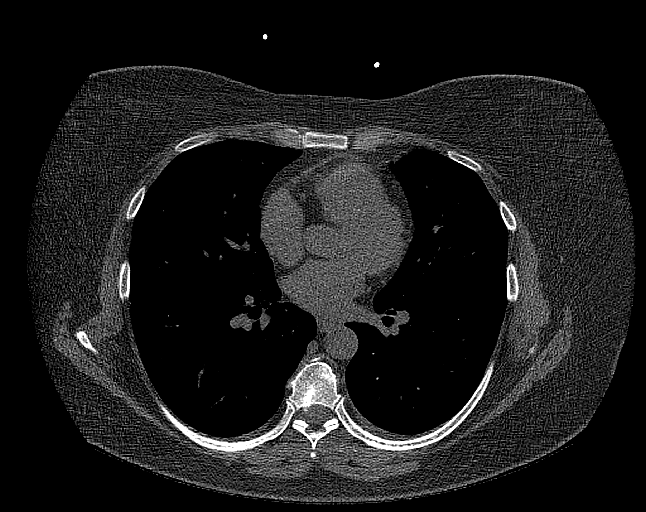
[im 58/70  vessel]
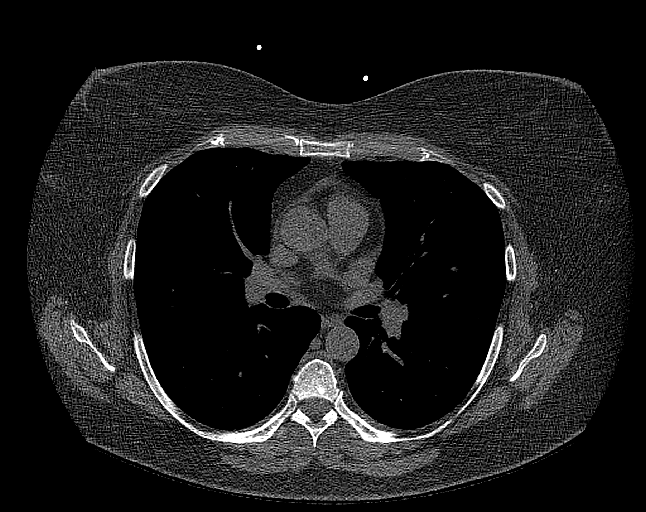

[14 of 20 positions shown; findings below may reference images not displayed]

FINDINGS: CORONARY CALCIUM SCORES:

Left Main: 0

LAD: 0

LCx: 0

RCA: 0

Total Agatston Score: 0

[HOSPITAL] percentile: 0

AORTA MEASUREMENTS:

Ascending Aorta: 30 mm

Descending Aorta: 22 mm

OTHER FINDINGS:


## 2022-01-19 ENCOUNTER — Other Ambulatory Visit: Payer: Self-pay | Admitting: Obstetrics & Gynecology

## 2022-01-22 ENCOUNTER — Other Ambulatory Visit: Payer: Self-pay | Admitting: Student

## 2022-01-29 ENCOUNTER — Other Ambulatory Visit: Payer: Self-pay | Admitting: Obstetrics & Gynecology

## 2022-01-29 ENCOUNTER — Telehealth: Payer: Self-pay | Admitting: Obstetrics & Gynecology

## 2022-01-29 MED ORDER — ETONOGESTREL-ETHINYL ESTRADIOL 0.12-0.015 MG/24HR VA RING: VAGINAL_RING | VAGINAL | 12 refills | Status: AC

## 2022-01-29 NOTE — Telephone Encounter (Signed)
Patient called concerning her nuvaring refill request from her pharmacy. Please advise. ?

## 2022-01-29 NOTE — Telephone Encounter (Signed)
Pharmacy refill request. Please advise. Patient called asking why it had got denied.  ?

## 2022-02-09 NOTE — Progress Notes (Unsigned)
Patient referred by Shelby Greene* for tachycardia  Subjective:   Shelby Greene, female    DOB: 11/19/1975, 46 y.o.   MRN: [id number]   No chief complaint on file.    HPI  46 y.o.  female with family history of early CAD, referred for evaluation of palpitations and chest pain symptoms.  Given patient's symptoms of palpitation she is enrolled in cardio logs monitoring, which has revealed no significant cardiac arrhythmias, just sinus rhythm.  Also due to chest pain symptoms patient underwent exercise stress test and coronary calcium scoring.  Total coronary calcium score was 0 and stress test noted low exercise capacity with EKG changes equivocal for ischemia.  Patient was last seen in the office 08/16/2021 at which time was started on Toprol-XL given palpitations.  He now presents for 14-monthfollow-up. ***  ***  Patient reports she continues to have intermittent episodes of palpitations and chest discomfort.  Denies dyspnea, syncope, near syncope.   Current Outpatient Medications on File Prior to Visit  Medication Sig Dispense Refill   albuterol (VENTOLIN HFA) 108 (90 Base) MCG/ACT inhaler Inhale 1-2 puffs into the lungs every 6 (six) hours as needed for wheezing or shortness of breath. 18 g 0   cetirizine (ZYRTEC) 10 MG tablet Take by mouth.     etonogestrel-ethinyl estradiol (NUVARING) 0.12-0.015 MG/24HR vaginal ring INSERT 1 RING VAGINALLY AND LEAVE IN PLACE FOR 3 CONSECUTIVE WEEKS, THEN REMOVE FOR 1 WEEK. 1 each 12   etonogestrel-ethinyl estradiol (NUVARING) 0.12-0.015 MG/24HR vaginal ring Insert vaginally and leave in place for 3 consecutive weeks, then remove for 1 week. 1 each 12   metoprolol succinate (TOPROL-XL) 25 MG 24 hr tablet TAKE ONE TABLET BY MOUTH ONE TIME DAILY WITH OR IMMEDIATELY FOLLOWING A MEAL 30 tablet 0   Semaglutide,0.25 or 0.5MG/DOS, 2 MG/1.5ML SOPN Inject into the skin.     sertraline (ZOLOFT) 50 MG tablet Take 25 tablets by mouth daily.      No current facility-administered medications on file prior to visit.    Cardiovascular and other pertinent studies: ***   Exercise treadmill stress test 08/14/2021: Exercise treadmill stress test performed using Bruce protocol.  Patient reached 4.6 METS, and 91% of age predicted maximum heart rate.  Exercise capacity was low.  No chest pain reported. Normal heart rate and hemodynamic response.  Stress EKG showed sinus tachycardia, <1 mm upsloping ST depression leads II, III, aVF, normalize 2 min into recovery. EKG changes are equivocal for ischemia.   Recommend clinical correlation.  CT cardiac scoring 08/07/2021: Coronary calcium score of 0.  EKG 06/29/2021: Sinus rhythm 93 bpm Normal EKG   Recent labs: 05/24/2021: Glucose 86, BUN/Cr 11/0.68. EGFR >90. Na/K 140/4. Rest of the CMP normal H/H 14/42. MCV 82. Platelets 281 Chol 169, TG 162, HDL 57, LDL 96 HbA1C N/A TSH N/A    Review of Systems  Cardiovascular:  Positive for chest pain (stable) and palpitations (stable). Negative for claudication, dyspnea on exertion, leg swelling, near-syncope, orthopnea, paroxysmal nocturnal dyspnea and syncope.  Neurological:  Negative for dizziness.        There were no vitals filed for this visit.    There is no height or weight on file to calculate BMI. There were no vitals filed for this visit.    Objective:   Physical Exam Vitals and nursing note reviewed.  Constitutional:      General: She is not in acute distress. Neck:     Vascular: No JVD.  Cardiovascular:  Rate and Rhythm: Normal rate and regular rhythm.     Heart sounds: Normal heart sounds. No murmur heard. Pulmonary:     Effort: Pulmonary effort is normal.     Breath sounds: Normal breath sounds. No wheezing or rales.  Musculoskeletal:     Right lower leg: No edema.     Left lower leg: No edema.  ***Physical exam unchanged compared to previous office visit.     Assessment & Recommendations:   46 y.o.  female with family history of early CAD, now with palpitations and chest pain symptoms. *** Tachycardia, palpitations:  No significant cardiac arrhythmias noted on cardio logs for review Again discussed vagal maneuvers Patient continues to have palpitations which are bothersome, therefore recommend low-dose beta-blocker therapy. Start Toprol-XL 25 mg p.o. daily. Counseled patient regarding side effects, she will notify our office if any issues with initiation of beta-blocker therapy.  Chest pain: Intermittent atypical episodes Reviewed and discussed at length with patient coronary calcium scoring and stress test.  Given coronary calcium score of 0 do not feel further ischemic evaluation is indicated despite EKG and stress test equivocal for ischemia. Advised patient to increase physical activity and focus on diet and lifestyle modifications, particularly weight loss.   Follow-up in 6 months, sooner if needed, for palpitations.  If patient is stable at that time could consider as needed follow-up.   Shelby Berthold, PA-C 02/09/2022, 3:26 PM Office: [phone]

## 2022-02-13 ENCOUNTER — Ambulatory Visit: Payer: BC Managed Care – PPO | Admitting: Student

## 2022-02-13 DIAGNOSIS — R000 Tachycardia, unspecified: Secondary | ICD-10-CM

## 2022-02-13 DIAGNOSIS — R002 Palpitations: Secondary | ICD-10-CM

## 2022-02-23 ENCOUNTER — Other Ambulatory Visit: Payer: Self-pay

## 2022-02-23 ENCOUNTER — Other Ambulatory Visit: Payer: Self-pay | Admitting: Student

## 2022-02-23 MED ORDER — METOPROLOL SUCCINATE ER 25 MG PO TB24: ORAL_TABLET | ORAL | 0 refills | Status: DC

## 2022-05-25 ENCOUNTER — Other Ambulatory Visit: Payer: Self-pay

## 2022-05-25 MED ORDER — METOPROLOL SUCCINATE ER 25 MG PO TB24: ORAL_TABLET | ORAL | 0 refills | Status: DC

## 2022-05-30 ENCOUNTER — Other Ambulatory Visit: Payer: Self-pay | Admitting: Adult Health

## 2022-05-30 DIAGNOSIS — Z1231 Encounter for screening mammogram for malignant neoplasm of breast: Secondary | ICD-10-CM

## 2022-06-13 ENCOUNTER — Other Ambulatory Visit: Payer: Self-pay

## 2022-06-20 ENCOUNTER — Ambulatory Visit
Admission: RE | Admit: 2022-06-20 | Discharge: 2022-06-20 | Disposition: A | Discharge status: Home / Self Care | Payer: BC Managed Care – PPO | Source: Ambulatory Visit | Attending: Adult Health | Admitting: Adult Health

## 2022-06-20 DIAGNOSIS — Z1231 Encounter for screening mammogram for malignant neoplasm of breast: Secondary | ICD-10-CM

## 2022-08-22 ENCOUNTER — Other Ambulatory Visit: Payer: Self-pay | Admitting: Cardiology

## 2022-11-18 ENCOUNTER — Other Ambulatory Visit: Payer: Self-pay | Admitting: Cardiology

## 2022-12-03 ENCOUNTER — Ambulatory Visit (INDEPENDENT_AMBULATORY_CARE_PROVIDER_SITE_OTHER): Payer: BC Managed Care – PPO

## 2022-12-03 ENCOUNTER — Ambulatory Visit: Payer: BC Managed Care – PPO | Admitting: Podiatry

## 2022-12-03 DIAGNOSIS — M7752 Other enthesopathy of left foot: Secondary | ICD-10-CM | POA: Diagnosis not present

## 2022-12-03 DIAGNOSIS — R52 Pain, unspecified: Secondary | ICD-10-CM

## 2022-12-03 MED ORDER — CYCLOBENZAPRINE HCL 10 MG PO TABS: 10.0000 mg | ORAL_TABLET | Freq: Every day | ORAL | 0 refills | Status: AC

## 2022-12-03 MED ORDER — OXYCODONE-ACETAMINOPHEN 5-325 MG PO TABS: 1.0000 | ORAL_TABLET | Freq: Four times a day (QID) | ORAL | 0 refills | Status: AC | PRN

## 2022-12-03 MED ORDER — MELOXICAM 15 MG PO TABS: 15.0000 mg | ORAL_TABLET | Freq: Every day | ORAL | 1 refills | Status: DC

## 2022-12-03 MED ORDER — BETAMETHASONE SOD PHOS & ACET 6 (3-3) MG/ML IJ SUSP
3.0000 mg | Freq: Once | INTRAMUSCULAR | Status: AC
  Administered 2022-12-03: 3 mg via INTRA_ARTICULAR

## 2022-12-03 NOTE — Progress Notes (Signed)
   Chief Complaint  Patient presents with   Foot Injury    Patient came in today for a left ankle injury, happened a week ago, rate of pain 2 out of 10, TX: cam boot, knee scooter, X-rays taken today    HPI: 47 y.o. female presenting today for evaluation of a left ankle sprain that occurred about 2 weeks ago.  Patient states that on 11/24/2022 she stepped out of a truck in the rain and slipped injuring her left ankle.  She is currently wearing an old cam walker that she has.  She has been also using a knee scooter.  She presents for further treatment and evaluation  No past medical history on file.  Past Surgical History:  Procedure Laterality Date   CESAREAN SECTION     SHOULDER SURGERY  12/2020    Allergies  Allergen Reactions   Morphine And Related Hives    Hives    Short Ragweed Pollen Ext Hives    Other reaction(s): Wheezing (ALLERGY/intolerance)     Physical Exam: General: The patient is alert and oriented x3 in no acute distress.  Dermatology: Skin is warm, dry and supple bilateral lower extremities. Negative for open lesions or macerations.  Vascular: Palpable pedal pulses bilaterally. Capillary refill within normal limits.  Mild edema noted left ankle Neurological: Light touch and protective threshold grossly intact  Musculoskeletal Exam: No pedal deformities noted.  There is tenderness with palpation throughout the ankle joint left  Radiographic Exam LT foot and ankle 12/03/2022:  Normal osseous mineralization. Joint spaces preserved.  No fracture identified.  No malalignment.  Assessment: 1.  Ankle sprain left.  DOI: 11/24/2022 2.  Nocturnal foot cramps after the ankle sprain injury  Plan of Care:  1. Patient evaluated. X-Rays reviewed.  2.  Injection of 0.5 cc Celestone Soluspan injected left ankle joint 3.  Compression sleeve dispensed.  Wear daily 4.  New cam boot dispensed.  WBAT.  She may continue the knee scooter as needed 5.  Prescription for Flexeril 10  mg nightly 6.  Prescription for meloxicam 15 mg daily 7.  Prescription for Percocet 5/325 mg every 6 hours as needed pain #20 8.  Return to clinic 3 weeks.  If there is no improvement we will consider an MRI.  If there is improvement recommend physical therapy      Edrick Kins, DPM [facility]  Dr. Edrick Kins, DPM    [address],  [address]                Office [phone]  Fax [phone]

## 2022-12-10 ENCOUNTER — Other Ambulatory Visit: Payer: Self-pay | Admitting: Cardiology

## 2022-12-11 ENCOUNTER — Other Ambulatory Visit: Payer: Self-pay | Admitting: Cardiology

## 2022-12-11 MED ORDER — METOPROLOL SUCCINATE ER 25 MG PO TB24: ORAL_TABLET | ORAL | 0 refills | Status: DC

## 2023-01-29 ENCOUNTER — Other Ambulatory Visit: Payer: Self-pay | Admitting: Podiatry

## 2023-03-02 ENCOUNTER — Other Ambulatory Visit: Payer: Self-pay | Admitting: Obstetrics & Gynecology

## 2023-03-11 ENCOUNTER — Other Ambulatory Visit: Payer: Self-pay | Admitting: Cardiology

## 2023-04-12 ENCOUNTER — Other Ambulatory Visit: Payer: Self-pay | Admitting: Podiatry

## 2023-06-04 ENCOUNTER — Other Ambulatory Visit: Payer: Self-pay | Admitting: Cardiology

## 2023-06-06 ENCOUNTER — Other Ambulatory Visit: Payer: Self-pay | Admitting: Cardiology

## 2023-06-06 ENCOUNTER — Other Ambulatory Visit: Payer: Self-pay

## 2023-06-06 MED ORDER — METOPROLOL SUCCINATE ER 25 MG PO TB24: ORAL_TABLET | ORAL | 0 refills | Status: DC

## 2023-06-26 ENCOUNTER — Ambulatory Visit: Payer: BC Managed Care – PPO | Attending: Cardiology | Admitting: Cardiology

## 2023-06-26 DIAGNOSIS — R000 Tachycardia, unspecified: Secondary | ICD-10-CM

## 2023-06-26 DIAGNOSIS — Z8249 Family history of ischemic heart disease and other diseases of the circulatory system: Secondary | ICD-10-CM

## 2023-06-26 DIAGNOSIS — R072 Precordial pain: Secondary | ICD-10-CM | POA: Insufficient documentation

## 2023-06-26 DIAGNOSIS — R002 Palpitations: Secondary | ICD-10-CM | POA: Insufficient documentation

## 2023-06-26 NOTE — Progress Notes (Unsigned)
Error

## 2023-07-15 ENCOUNTER — Other Ambulatory Visit: Payer: Self-pay | Admitting: Family Medicine

## 2023-07-15 ENCOUNTER — Ambulatory Visit
Admission: RE | Admit: 2023-07-15 | Discharge: 2023-07-15 | Disposition: A | Discharge status: Home / Self Care | Payer: BC Managed Care – PPO | Source: Ambulatory Visit | Attending: Family Medicine | Admitting: Family Medicine

## 2023-07-15 DIAGNOSIS — Z1231 Encounter for screening mammogram for malignant neoplasm of breast: Secondary | ICD-10-CM

## 2023-07-24 ENCOUNTER — Emergency Department (HOSPITAL_BASED_OUTPATIENT_CLINIC_OR_DEPARTMENT_OTHER)
Admission: EM | Admit: 2023-07-24 | Discharge: 2023-07-25 | Discharge status: Against Medical Advice | Payer: BC Managed Care – PPO | Attending: Emergency Medicine | Admitting: Emergency Medicine

## 2023-07-24 ENCOUNTER — Emergency Department (HOSPITAL_BASED_OUTPATIENT_CLINIC_OR_DEPARTMENT_OTHER): Payer: BC Managed Care – PPO

## 2023-07-24 ENCOUNTER — Other Ambulatory Visit: Payer: Self-pay

## 2023-07-24 DIAGNOSIS — N3001 Acute cystitis with hematuria: Secondary | ICD-10-CM

## 2023-07-24 DIAGNOSIS — R109 Unspecified abdominal pain: Secondary | ICD-10-CM | POA: Diagnosis present

## 2023-07-24 LAB — URINALYSIS, ROUTINE W REFLEX MICROSCOPIC
Glucose, UA: NEGATIVE mg/dL
Ketones, ur: NEGATIVE mg/dL
Leukocytes,Ua: NEGATIVE
Nitrite: POSITIVE — AB
Specific Gravity, Urine: 1.018 (ref 1.005–1.030)
pH: 5.5 (ref 5.0–8.0)

## 2023-07-24 LAB — CBC WITH DIFFERENTIAL/PLATELET
Abs Immature Granulocytes: 0.03 10*3/uL (ref 0.00–0.07)
Basophils Absolute: 0 10*3/uL (ref 0.0–0.1)
Basophils Relative: 0 %
Eosinophils Absolute: 0.1 10*3/uL (ref 0.0–0.5)
Eosinophils Relative: 1 %
HCT: 42 % (ref 36.0–46.0)
Hemoglobin: 13.5 g/dL (ref 12.0–15.0)
Immature Granulocytes: 0 %
Lymphocytes Relative: 25 %
Lymphs Abs: 2.8 10*3/uL (ref 0.7–4.0)
MCH: 27.1 pg (ref 26.0–34.0)
MCHC: 32.1 g/dL (ref 30.0–36.0)
MCV: 84.3 fL (ref 80.0–100.0)
Monocytes Absolute: 0.9 10*3/uL (ref 0.1–1.0)
Monocytes Relative: 8 %
Neutro Abs: 7.2 10*3/uL (ref 1.7–7.7)
Neutrophils Relative %: 66 %
Platelets: 286 10*3/uL (ref 150–400)
RBC: 4.98 MIL/uL (ref 3.87–5.11)
RDW: 14.6 % (ref 11.5–15.5)
WBC: 11.1 10*3/uL — ABNORMAL HIGH (ref 4.0–10.5)
nRBC: 0 % (ref 0.0–0.2)

## 2023-07-24 LAB — COMPREHENSIVE METABOLIC PANEL
ALT: 17 U/L (ref 0–44)
AST: 22 U/L (ref 15–41)
Albumin: 3.9 g/dL (ref 3.5–5.0)
Alkaline Phosphatase: 67 U/L (ref 38–126)
Anion gap: 9 (ref 5–15)
BUN: 16 mg/dL (ref 6–20)
CO2: 24 mmol/L (ref 22–32)
Calcium: 9.9 mg/dL (ref 8.9–10.3)
Chloride: 103 mmol/L (ref 98–111)
Creatinine, Ser: 0.71 mg/dL (ref 0.44–1.00)
GFR, Estimated: >60 mL/min (ref >60)
Glucose, Bld: 83 mg/dL (ref 70–99)
Potassium: 4 mmol/L (ref 3.5–5.1)
Sodium: 136 mmol/L (ref 135–145)
Total Bilirubin: 0.5 mg/dL (ref 0.3–1.2)
Total Protein: 7.4 g/dL (ref 6.5–8.1)

## 2023-07-24 LAB — PREGNANCY, URINE: Preg Test, Ur: NEGATIVE

## 2023-07-24 LAB — LIPASE, BLOOD: Lipase: 19 U/L (ref 11–51)

## 2023-07-24 MED ORDER — CEFADROXIL 500 MG PO CAPS: 500.0000 mg | ORAL_CAPSULE | Freq: Two times a day (BID) | ORAL | 0 refills | Status: AC

## 2023-07-24 MED ORDER — FENTANYL CITRATE PF 50 MCG/ML IJ SOSY
50.0000 ug | PREFILLED_SYRINGE | INTRAMUSCULAR | Status: DC | PRN
  Administered 2023-07-24: 50 ug via INTRAVENOUS
  Filled 2023-07-24: qty 1

## 2023-07-24 MED ORDER — PHENAZOPYRIDINE HCL 200 MG PO TABS: 200.0000 mg | ORAL_TABLET | Freq: Three times a day (TID) | ORAL | 0 refills | Status: AC

## 2023-07-24 MED ORDER — KETOROLAC TROMETHAMINE 15 MG/ML IJ SOLN
15.0000 mg | Freq: Once | INTRAMUSCULAR | Status: AC
  Administered 2023-07-24: 15 mg via INTRAVENOUS
  Filled 2023-07-24: qty 1

## 2023-07-24 NOTE — ED Triage Notes (Signed)
Pt POV reporting R side flank pain that began this morning. Pt took azo with no relief. Hx UTIs and kidney stones, thinks this is a kidney stone.

## 2023-07-24 NOTE — ED Notes (Signed)
PA and RN spoke with pt and pt stated she was needing to leave ASAP. PA and RN explained recommendation to stay for further and risks to leaving prior to medical recommendations. Pt stated she understands she will be leaving against medical advice and the risks and still refuses to stay. Pt refused to stay to sign AMA form and left.

## 2023-07-24 NOTE — ED Provider Notes (Signed)
[place] EMERGENCY DEPARTMENT AT [facility] Provider Note   CSN: [id number] Arrival date & time: 07/24/23  1436     History  Chief Complaint  Patient presents with   Flank Pain    Shelby Greene is a 47 y.o. female.  Patient with history of kidney stones presents today with complaints of flank pain.  She states that same began this morning and has been persistent since then.  It feels like her previous stones.  She does note some dysuria and urinary frequency.  Pain is right-sided.  No nausea, vomiting, or diarrhea.  She has never needed surgical intervention for stones previously.  Denies fevers or chills.  Does note history of C-section, no other abdominal surgeries.  The history is provided by the patient. No language interpreter was used.  Flank Pain       Home Medications Prior to Admission medications   Medication Sig Start Date End Date Taking? Authorizing Provider  albuterol (VENTOLIN HFA) 108 (90 Base) MCG/ACT inhaler Inhale 1-2 puffs into the lungs every 6 (six) hours as needed for wheezing or shortness of breath. 09/25/20   Eustace Moore, MD  cetirizine (ZYRTEC) 10 MG tablet Take by mouth.    [provider]  cyclobenzaprine (FLEXERIL) 10 MG tablet Take 1 tablet (10 mg total) by mouth at bedtime. 12/03/22   Felecia Shelling, DPM  etonogestrel-ethinyl estradiol (NUVARING) 0.12-0.015 MG/24HR vaginal ring Insert vaginally and leave in place for 3 consecutive weeks, then remove for 1 week. 01/29/22   Lazaro Arms, MD  etonogestrel-ethinyl estradiol (NUVARING) 0.12-0.015 MG/24HR vaginal ring INSERT 1 RING VAGINALLY AND LEAVE IN PLACE FOR 3 CONSECUTIVE WEEKS THEN REMOVE FOR 1 WEEK 03/07/23   Lazaro Arms, MD  meloxicam (MOBIC) 15 MG tablet TAKE 1 TABLET(15 MG) BY MOUTH DAILY 01/29/23   Felecia Shelling, DPM  metoprolol succinate (TOPROL-XL) 25 MG 24 hr tablet TAKE 1 TABLET BY MOUTH EVERY DAY WITH OR IMMEDIATELY FOLLOWING A MEAL 12/11/22   Yates Decamp, MD   metoprolol succinate (TOPROL-XL) 25 MG 24 hr tablet TAKE 1 TABLET BY MOUTH EVERY DAY WITH OR IMMEDIATELY FOLLOWING A MEAL 06/07/23   Patwardhan, Manish J, MD  oxyCODONE-acetaminophen (PERCOCET) 5-325 MG tablet Take 1 tablet by mouth every 6 (six) hours as needed for severe pain. 12/03/22   Felecia Shelling, DPM  Semaglutide,0.25 or 0.5MG /DOS, 2 MG/1.5ML SOPN Inject into the skin. 05/24/21   [provider]  sertraline (ZOLOFT) 50 MG tablet Take 25 tablets by mouth daily. 01/31/21   [provider]      Allergies    Morphine and codeine and Short ragweed pollen ext    Review of Systems   Review of Systems  Genitourinary:  Positive for flank pain.  All other systems reviewed and are negative.   Physical Exam Updated Vital Signs BP 115/61   Pulse 72   Temp 98.4 F (36.9 C) (Oral)   Resp 14   Ht 5\' 3"  (1.6 m)   Wt 99.8 kg   SpO2 97%   BMI 38.97 kg/m  Physical Exam Vitals and nursing note reviewed.  Constitutional:      General: She is not in acute distress.    Appearance: Normal appearance. She is normal weight. She is not ill-appearing, toxic-appearing or diaphoretic.  HENT:     Head: Normocephalic and atraumatic.  Cardiovascular:     Rate and Rhythm: Normal rate.  Pulmonary:     Effort: Pulmonary effort is normal. No respiratory  distress.  Abdominal:     General: Abdomen is flat.     Palpations: Abdomen is soft.     Tenderness: There is no abdominal tenderness.  Musculoskeletal:        General: Normal range of motion.     Cervical back: Normal range of motion.  Skin:    General: Skin is warm and dry.  Neurological:     General: No focal deficit present.     Mental Status: She is alert.  Psychiatric:        Mood and Affect: Mood normal.        Behavior: Behavior normal.     ED Results / Procedures / Treatments   Labs (all labs ordered are listed, but only abnormal results are displayed) Labs Reviewed  URINALYSIS, ROUTINE W REFLEX MICROSCOPIC -  Abnormal; Notable for the following components:      Result Value   Hgb urine dipstick SMALL (*)    Bilirubin Urine SMALL (*)    Protein, ur TRACE (*)    Nitrite POSITIVE (*)    Bacteria, UA RARE (*)    All other components within normal limits  CBC WITH DIFFERENTIAL/PLATELET - Abnormal; Notable for the following components:   WBC 11.1 (*)    All other components within normal limits  URINE CULTURE  PREGNANCY, URINE  COMPREHENSIVE METABOLIC PANEL  LIPASE, BLOOD    EKG None  Radiology CT Renal Stone Study  Result Date: 07/24/2023 CLINICAL DATA:  Abdominal and flank pain with stone suspected. Right-sided flank pain beginning this morning. Previous history of urinary tract infections and kidney stones. EXAM: CT ABDOMEN AND PELVIS WITHOUT CONTRAST TECHNIQUE: Multidetector CT imaging of the abdomen and pelvis was performed following the standard protocol without IV contrast. RADIATION DOSE REDUCTION: This exam was performed according to the departmental dose-optimization program which includes automated exposure control, adjustment of the mA and/or kV according to patient size and/or use of iterative reconstruction technique. COMPARISON:  04/13/2012 FINDINGS: Lower chest: Lung bases are clear. Hepatobiliary: No focal liver abnormality is seen. No gallstones, gallbladder wall thickening, or biliary dilatation. Pancreas: Unremarkable. No pancreatic ductal dilatation or surrounding inflammatory changes. Spleen: Normal in size without focal abnormality. Adrenals/Urinary Tract: No adrenal gland nodules. Kidneys are symmetrical. No hydronephrosis or hydroureter. No renal, ureteral, or bladder stones are seen. Bladder is decompressed with bladder wall thickening. This could be due to cystitis or under distention. Correlate with urinalysis. Stomach/Bowel: Stomach is within normal limits. Appendix appears normal. No evidence of bowel wall thickening, distention, or inflammatory changes. Vascular/Lymphatic:  No significant vascular findings are present. No enlarged abdominal or pelvic lymph nodes. Reproductive: Uterus and bilateral adnexa are unremarkable. Other: No abdominal wall hernia or abnormality. No abdominopelvic ascites. Musculoskeletal: No acute or significant osseous findings. IMPRESSION: 1. No renal or ureteral stone or obstruction. 2. Bladder wall thickening may be due to under distention or cystitis. Correlate with urinalysis. Electronically Signed   By: Burman Nieves M.D.   On: 07/24/2023 18:27    Procedures Procedures    Medications Ordered in ED Medications  fentaNYL (SUBLIMAZE) injection 50 mcg (50 mcg Intravenous Given 07/24/23 1529)  ketorolac (TORADOL) 15 MG/ML injection 15 mg (15 mg Intravenous Given 07/24/23 1601)    ED Course/ Medical Decision Making/ A&P                                 Medical Decision Making Amount and/or Complexity of  Data Reviewed Labs: ordered. Radiology: ordered.  Risk Prescription drug management.   This patient is a 47 y.o. female who presents to the ED for concern of flank pain, this involves an extensive number of treatment options, and is a complaint that carries with it a high risk of complications and morbidity. The emergent differential diagnosis prior to evaluation includes, but is not limited to,  AAA, renal artery/vein embolism/thrombosis, mesenteric ischemia, pyelonephritis, nephrolithiasis, cystitis, biliary colic, pancreatitis, perforated peptic ulcer, appendicitis, diverticulitis, bowel obstruction, Ectopic Pregnancy, PID/TOA, Ovarian cyst, Ovarian torsion   This is not an exhaustive differential.   Past Medical History / Co-morbidities / Social History:  has no past medical history on file.  Physical Exam: Physical exam performed. The pertinent findings include: Abdomen soft and nontender without CVA tenderness.  Lab Tests: I ordered, and personally interpreted labs.  The pertinent results include:  WBC 11.1, UA  concerning for infection, culture pending   Imaging Studies: I ordered imaging studies including Ct renal. I independently visualized and interpreted imaging which showed   1. No renal or ureteral stone or obstruction. 2. Bladder wall thickening may be due to under distention or cystitis. Correlate with urinalysis.    I agree with the radiologist interpretation.   Medications: I ordered medication including toradol  for pain. Reevaluation of the patient after these medicines showed that the patient improved. I have reviewed the patients home medicines and have made adjustments as needed.   Disposition: After consideration of the diagnostic results and the patients response to treatment, I feel that emergency department workup does not suggest an emergent condition requiring admission or immediate intervention beyond what has been performed at this time. The plan is: discharge with duricef for UTI and pyridium for pain. Patient understanding and amenable with this plan. Pain controlled with medications. Did consider admission given flank pain with UTI, however patient prefers to go home. Also offered one round of IV antibiotics which she declined as she did not want to wait for this. Her vital signs are stable, no signs of sepsis. Evaluation and diagnostic testing in the emergency department does not suggest an emergent condition requiring admission or immediate intervention beyond what has been performed at this time.  Plan for discharge with close PCP follow-up.  Patient is understanding and amenable with plan, educated on red flag symptoms that would prompt immediate return.  Patient discharged in stable condition.  Of note, patient unfortunately left prior to receiving discharge paperwork. I did re-discuss plan with patient over the phone, antibiotics sent to her desired pharmacy.  Final Clinical Impression(s) / ED Diagnoses Final diagnoses:  Flank pain  Acute cystitis with hematuria     Rx / DC Orders ED Discharge Orders          Ordered    cefadroxil (DURICEF) 500 MG capsule  2 times daily        07/24/23 1825    phenazopyridine (PYRIDIUM) 200 MG tablet  3 times daily        07/24/23 1833          An After Visit Summary was printed and given to the patient.     Silva Bandy, PA-C 07/24/23 1839    Rexford Maus, DO 07/24/23 2007

## 2023-07-26 LAB — URINE CULTURE: Culture: >=100000 — AB

## 2023-07-27 ENCOUNTER — Telehealth (HOSPITAL_BASED_OUTPATIENT_CLINIC_OR_DEPARTMENT_OTHER): Payer: Self-pay | Admitting: *Deleted

## 2023-07-27 NOTE — Telephone Encounter (Signed)
Post ED Visit - Positive Culture Follow-up  Culture report reviewed by antimicrobial stewardship pharmacist: [facility] []  Enzo Bi, Pharm.D. []  Celedonio Miyamoto, Pharm.D., BCPS AQ-ID []  Garvin Fila, Pharm.D., BCPS []  Georgina Pillion, Pharm.D., BCPS []  [place], [address].D., BCPS, AAHIVP []  Estella Husk, Pharm.D., BCPS, AAHIVP []  Lysle Pearl, PharmD, BCPS []  Phillips Climes, PharmD, BCPS []  Agapito Games, PharmD, BCPS []  Verlan Friends, PharmD []  Mervyn Gay, PharmD, BCPS [x]  Gillis Ends, PharmD  [facility] []  Len Childs, PharmD []  Greer Pickerel, PharmD []  Adalberto Cole, PharmD []  Perlie Gold, Rph []  Lonell Face) Jean Rosenthal, PharmD []  Earl Many, PharmD []  Junita Push, PharmD []  Dorna Leitz, PharmD []  Terrilee Files, PharmD []  Lynann Beaver, PharmD []  Keturah Barre, PharmD []  Loralee Pacas, PharmD []  Bernadene Person, PharmD   Positive urine culture Treated with Cefadroxil, organism sensitive to the same and no further patient follow-up is required at this time.  Patsey Berthold 07/27/2023, 9:18 AM

## 2023-09-09 ENCOUNTER — Other Ambulatory Visit: Payer: Self-pay | Admitting: Cardiology

## 2023-09-13 ENCOUNTER — Telehealth: Payer: Self-pay | Admitting: Cardiology

## 2023-09-13 MED ORDER — METOPROLOL SUCCINATE ER 25 MG PO TB24: ORAL_TABLET | ORAL | 0 refills | Status: AC

## 2023-09-13 NOTE — Telephone Encounter (Signed)
*  STAT* If patient is at the pharmacy, call can be transferred to refill team.   1. Which medications need to be refilled? (please list name of each medication and dose if known) metoprolol succinate (TOPROL-XL) 25 MG 24 hr tablet    2. Would you like to learn more about the convenience, safety, & potential cost savings by using the [facility]? No     3. Are you open to using the [facility] No   4. Which pharmacy/location (including street and city if local pharmacy) is medication to be sent to?[id number] - [place], [place] - [address]    5. Do they need a 30 day or 90 day supply? 90 Day Supply

## 2023-09-27 ENCOUNTER — Other Ambulatory Visit: Payer: Self-pay | Admitting: Cardiology

## 2024-03-27 ENCOUNTER — Other Ambulatory Visit: Payer: Self-pay | Admitting: Obstetrics & Gynecology
